# Patient Record
Sex: Male | Born: 1955 | Race: White | Hispanic: No | Marital: Single | Smoking: Former smoker
Health system: Southern US, Community
[De-identification: ages and names within clinical notes are randomized; demographics above are authoritative.]

## PROBLEM LIST (undated history)

## (undated) DIAGNOSIS — E559 Vitamin D deficiency, unspecified: Secondary | ICD-10-CM

## (undated) DIAGNOSIS — K2270 Barrett's esophagus without dysplasia: Secondary | ICD-10-CM

## (undated) DIAGNOSIS — Z86010 Personal history of colonic polyps: Secondary | ICD-10-CM

## (undated) DIAGNOSIS — I10 Essential (primary) hypertension: Secondary | ICD-10-CM

## (undated) DIAGNOSIS — M8580 Other specified disorders of bone density and structure, unspecified site: Secondary | ICD-10-CM

## (undated) DIAGNOSIS — G4730 Sleep apnea, unspecified: Secondary | ICD-10-CM

## (undated) DIAGNOSIS — K5190 Ulcerative colitis, unspecified, without complications: Secondary | ICD-10-CM

## (undated) DIAGNOSIS — M25512 Pain in left shoulder: Secondary | ICD-10-CM

## (undated) DIAGNOSIS — E785 Hyperlipidemia, unspecified: Secondary | ICD-10-CM

## (undated) DIAGNOSIS — T7840XA Allergy, unspecified, initial encounter: Secondary | ICD-10-CM

## (undated) DIAGNOSIS — K219 Gastro-esophageal reflux disease without esophagitis: Secondary | ICD-10-CM

## (undated) HISTORY — DX: Allergy, unspecified, initial encounter: T78.40XA

## (undated) HISTORY — DX: Barrett's esophagus without dysplasia: K22.70

## (undated) HISTORY — DX: Ulcerative colitis, unspecified, without complications: K51.90

## (undated) HISTORY — DX: Vitamin D deficiency, unspecified: E55.9

## (undated) HISTORY — DX: Hyperlipidemia, unspecified: E78.5

## (undated) HISTORY — DX: Gastro-esophageal reflux disease without esophagitis: K21.9

## (undated) HISTORY — PX: CHOLECYSTECTOMY: SHX55

## (undated) HISTORY — PX: UPPER GASTROINTESTINAL ENDOSCOPY: SHX188

## (undated) HISTORY — PX: TONSILLECTOMY: SUR1361

## (undated) HISTORY — PX: COLONOSCOPY: SHX174

## (undated) HISTORY — DX: Personal history of colonic polyps: Z86.010

---
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
## 2003-12-03 ENCOUNTER — Ambulatory Visit (HOSPITAL_COMMUNITY): Admission: RE | Admit: 2003-12-03 | Discharge: 2003-12-03 | Payer: Self-pay | Admitting: Gastroenterology

## 2004-01-02 ENCOUNTER — Encounter: Admission: RE | Admit: 2004-01-02 | Discharge: 2004-01-02 | Payer: Self-pay | Admitting: Gastroenterology

## 2004-02-10 ENCOUNTER — Encounter: Admission: RE | Admit: 2004-02-10 | Discharge: 2004-02-10 | Payer: Self-pay | Admitting: Gastroenterology

## 2004-03-19 ENCOUNTER — Observation Stay (HOSPITAL_COMMUNITY): Admission: RE | Admit: 2004-03-19 | Discharge: 2004-03-20 | Payer: Self-pay | Admitting: Surgery

## 2005-12-25 IMAGING — RF DG CHOLANGIOGRAM OPERATIVE
1 series · 13 of 13 positions shown · non-contrast
Comparison: none

CLINICAL DATA: Biliary dyskinesia.  Right upper quadrant abdominal pain.
 OPERATIVE CHOLANGIOGRAM, 03/19/04
 Comparing ultrasound of 02/10/04.

[Series 1: run · 13 of 13 slices shown]
[im 1/13]
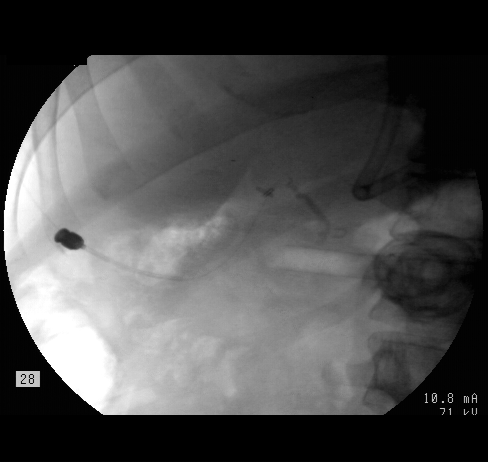
[im 2/13]
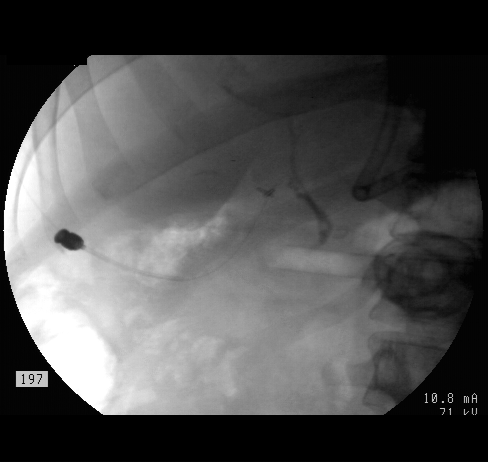
[im 3/13]
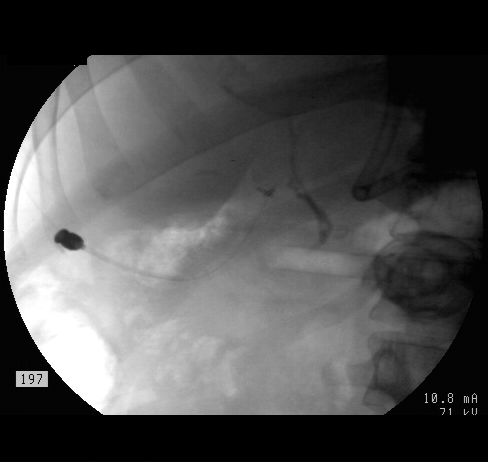
[im 4/13]
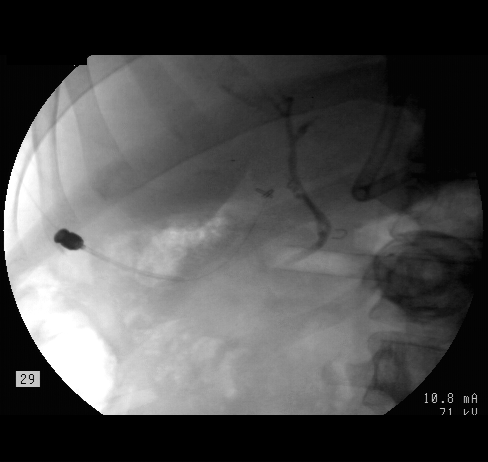
[im 5/13]
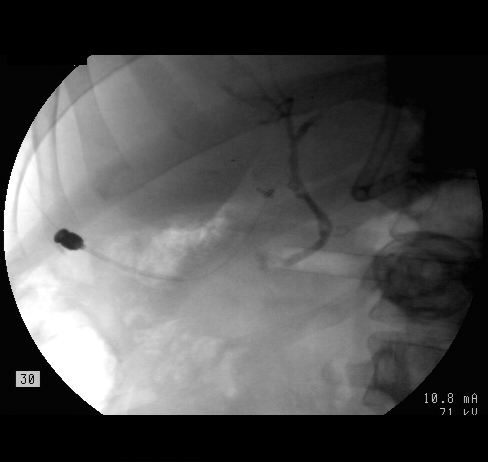
[im 6/13]
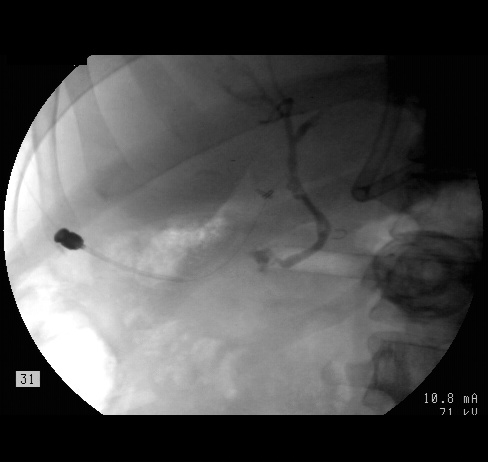
[im 7/13]
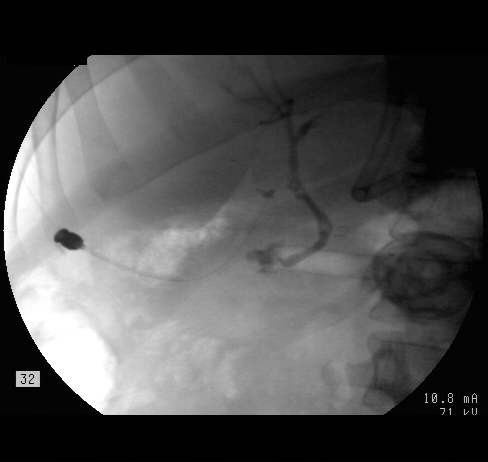
[im 8/13]
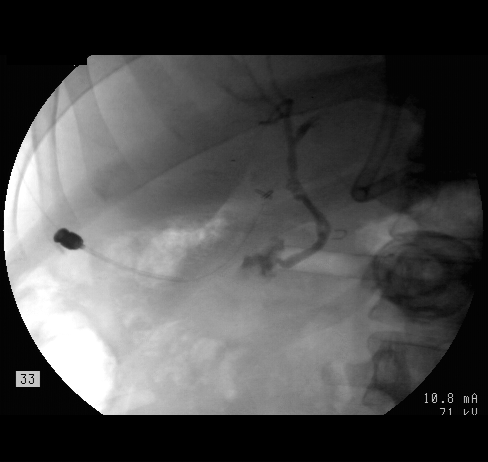
[im 9/13]
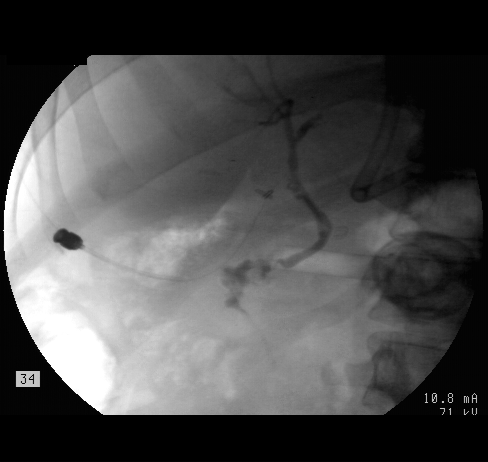
[im 10/13]
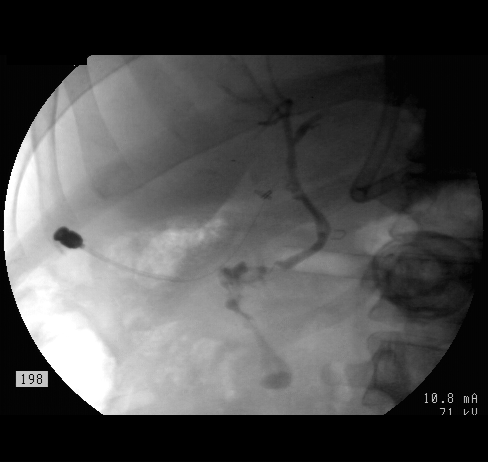
[im 11/13]
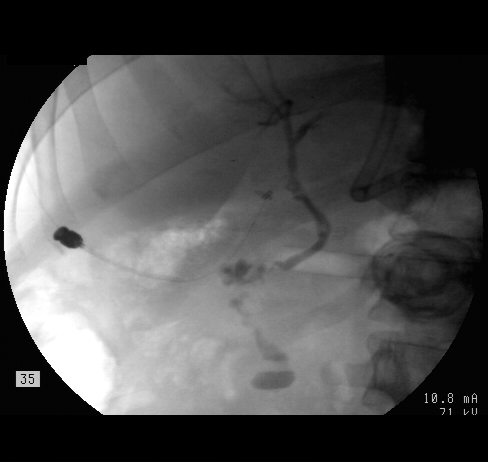
[im 12/13]
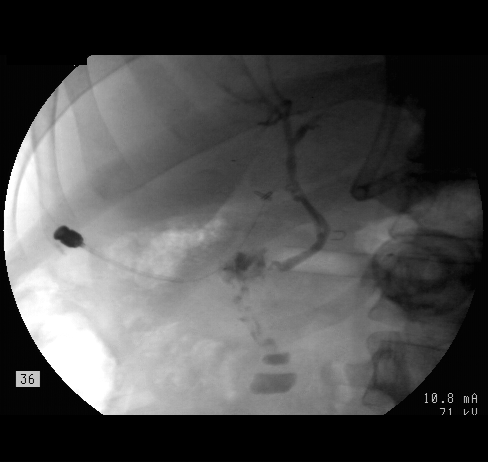
[im 13/13]
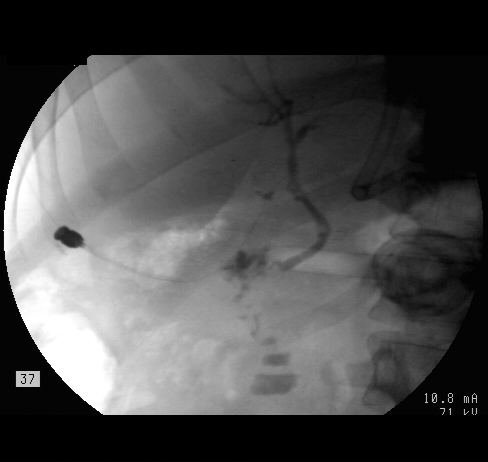

[13 of 13 positions shown; findings below may reference images not displayed]

FINDINGS: Operative cholangiogram was performed by Dr. Neesy Ya Neesy.  A series of consecutively obtained images demonstrate the cystic duct remnant to be cannulated.  This is injected and there is free extension of contrast into the duodenum.  Contrast extends up into the right and left hepatic ducts and intrahepatic bile ducts.
 In the vicinity of a staple in the common bile duct, there is a lucency over the common bile duct.  However, this appears to likely be associated with gas extending in the region of the duodenum or colon.  I doubt that this represents a retained non-mobile stone.
 IMPRESSION 
 No definite choledocholithiasis.  A vague lucency in the contrast column in the mid common bile duct appears to be associated with bowel gas rather than choledocholithiasis, and there is no associated obstruction.

## 2013-11-17 ENCOUNTER — Ambulatory Visit (INDEPENDENT_AMBULATORY_CARE_PROVIDER_SITE_OTHER): Payer: BC Managed Care – PPO | Admitting: *Deleted

## 2013-11-17 DIAGNOSIS — Z23 Encounter for immunization: Secondary | ICD-10-CM

## 2014-09-16 ENCOUNTER — Ambulatory Visit (INDEPENDENT_AMBULATORY_CARE_PROVIDER_SITE_OTHER): Payer: BC Managed Care – PPO | Admitting: *Deleted

## 2014-09-16 DIAGNOSIS — Z23 Encounter for immunization: Secondary | ICD-10-CM

## 2015-07-29 ENCOUNTER — Ambulatory Visit (INDEPENDENT_AMBULATORY_CARE_PROVIDER_SITE_OTHER): Payer: 59 | Admitting: Emergency Medicine

## 2015-07-29 VITALS — BP 142/90 | HR 68 | Temp 98.4°F | Resp 16 | Ht 67.5 in | Wt 191.0 lb

## 2015-07-29 DIAGNOSIS — Z23 Encounter for immunization: Secondary | ICD-10-CM

## 2015-07-29 DIAGNOSIS — Z0000 Encounter for general adult medical examination without abnormal findings: Secondary | ICD-10-CM

## 2015-07-29 DIAGNOSIS — K529 Noninfective gastroenteritis and colitis, unspecified: Secondary | ICD-10-CM | POA: Insufficient documentation

## 2015-07-29 DIAGNOSIS — E782 Mixed hyperlipidemia: Secondary | ICD-10-CM | POA: Diagnosis not present

## 2015-07-29 DIAGNOSIS — K5190 Ulcerative colitis, unspecified, without complications: Secondary | ICD-10-CM

## 2015-07-29 LAB — CBC
HCT: 46 % (ref 39.0–52.0)
HEMOGLOBIN: 15.8 g/dL (ref 13.0–17.0)
MCH: 32.6 pg (ref 26.0–34.0)
MCHC: 34.3 g/dL (ref 30.0–36.0)
MCV: 94.8 fL (ref 78.0–100.0)
MPV: 10.5 fL (ref 8.6–12.4)
PLATELETS: 187 10*3/uL (ref 150–400)
RBC: 4.85 MIL/uL (ref 4.22–5.81)
RDW: 13.5 % (ref 11.5–15.5)
WBC: 7.4 10*3/uL (ref 4.0–10.5)

## 2015-07-29 LAB — POCT UA - MICROSCOPIC ONLY
Bacteria, U Microscopic: NEGATIVE
Casts, Ur, LPF, POC: NEGATIVE
Crystals, Ur, HPF, POC: NEGATIVE
Yeast, UA: NEGATIVE

## 2015-07-29 LAB — POCT URINALYSIS DIPSTICK
Blood, UA: NEGATIVE
GLUCOSE UA: NEGATIVE
LEUKOCYTES UA: NEGATIVE
NITRITE UA: NEGATIVE
Protein, UA: NEGATIVE
Spec Grav, UA: >=1.03
UROBILINOGEN UA: 0.2
pH, UA: 5.5

## 2015-07-29 NOTE — Progress Notes (Signed)
Subjective:  Patient ID: Dennis Orr, male    DOB: 1956/07/21  Age: 59 y.o. MRN: [id number]  CC: Annual Exam   HPI Dennis Orr presents  for an annual physical. He has not had a physical examination was 3 years. He has a history of ulcerative colitis has been without treatment for that for several years due to change in his insurance policy related to his job loss.  History Dennis Orr has a past medical history of Allergy.   He has past surgical history that includes Cholecystectomy (N/A).   His  family history is not on file.  He   reports that he has never smoked. He does not have any smokeless tobacco history on file. He reports that he drinks about 10.8 oz of alcohol per week. He reports that he does not use illicit drugs.  No outpatient prescriptions prior to visit.   No facility-administered medications prior to visit.    Social History   Social History  . Marital Status: Single    Spouse Name: N/A  . Number of Children: N/A  . Years of Education: N/A   Social History Main Topics  . Smoking status: Never Smoker   . Smokeless tobacco: None  . Alcohol Use: 10.8 oz/week    18 Standard drinks or equivalent per week  . Drug Use: No  . Sexual Activity: Not Asked   Other Topics Concern  . None   Social History Narrative  . None     Review of Systems  Constitutional: Negative for fever, chills and appetite change.  HENT: Negative for congestion, ear pain, postnasal drip, sinus pressure and sore throat.   Eyes: Negative for pain and redness.  Respiratory: Negative for cough, shortness of breath and wheezing.   Cardiovascular: Negative for leg swelling.  Gastrointestinal: Negative for nausea, vomiting, abdominal pain, diarrhea, constipation and blood in stool.  Endocrine: Negative for polyuria.  Genitourinary: Negative for dysuria, urgency, frequency and flank pain.  Musculoskeletal: Negative for gait problem.  Skin: Negative for rash.  Neurological:  Negative for weakness and headaches.  Psychiatric/Behavioral: Negative for confusion and decreased concentration. The patient is not nervous/anxious.     Objective:  BP 142/90 mmHg  Pulse 68  Temp(Src) 98.4 F (36.9 C) (Oral)  Resp 16  Ht 5' 7.5" (1.715 m)  Wt 191 lb (86.637 kg)  BMI 29.46 kg/m2  SpO2 99%  Physical Exam  Constitutional: He is oriented to person, place, and time. He appears well-developed and well-nourished. No distress.  HENT:  Head: Normocephalic and atraumatic.  Right Ear: External ear normal.  Left Ear: External ear normal.  Nose: Nose normal.  Eyes: Conjunctivae and EOM are normal. Pupils are equal, round, and reactive to light. No scleral icterus.  Neck: Normal range of motion. Neck supple. No tracheal deviation present.  Cardiovascular: Normal rate, regular rhythm and normal heart sounds.   Pulmonary/Chest: Effort normal. No respiratory distress. He has no wheezes. He has no rales.  Abdominal: He exhibits no mass. There is no tenderness. There is no rebound and no guarding.  Musculoskeletal: He exhibits no edema.  Lymphadenopathy:    He has no cervical adenopathy.  Neurological: He is alert and oriented to person, place, and time. Coordination normal.  Skin: Skin is warm and dry. No rash noted.  Psychiatric: He has a normal mood and affect. His behavior is normal.      Assessment & Plan:   Dennis Orr was seen today for annual exam.  Diagnoses and all orders  for this visit:  Annual physical exam -     CBC -     Comprehensive metabolic panel -     Lipid panel -     POCT urinalysis dipstick -     POCT UA - Microscopic Only  Ulcerative colitis, without complications -     CBC -     Comprehensive metabolic panel -     Lipid panel -     POCT urinalysis dipstick -     POCT UA - Microscopic Only -     Ambulatory referral to Gastroenterology  Need for prophylactic vaccination and inoculation against influenza -     Flu Vaccine QUAD 36+ mos  IM   Dennis Orr does not currently have medications on file.  No orders of the defined types were placed in this encounter.    Appropriate red flag conditions were discussed with the patient as well as actions that should be taken.  Patient expressed his understanding.  Follow-up: Return if symptoms worsen or fail to improve.  Roselee Culver, MD

## 2015-07-29 NOTE — Patient Instructions (Signed)
Ulcerative Colitis Ulcerative colitis is a long lasting swelling and soreness (inflammation) of the colon (large intestine). In patients with ulcerative colitis, sores (ulcers) and inflammation of the inner lining of the colon lead to illness. Ulcerative colitis can also cause problems outside the digestive tract.  Ulcerative colitis is closely related to another condition of inflammation of the intestines called Crohn's disease. Together, they are frequently referred to as inflammatory bowel disease (IBD). Ulcerative colitis and Crohn's diseases are conditions that can last years to decades. Men and women are affected equally. They most commonly begin during adolescence and early adulthood. SYMPTOMS  Common symptoms of ulcerative colitis include rectal bleeding and diarrhea. There is a wide range of symptoms among patients with this disease depending on how severe the disease is. Some of these symptoms are:  Abdominal pain or cramping.  Diarrhea.  Fever.  Tiredness (fatigue).  Weight loss.  Night sweats.  Rectal pain.  Feeling the immediate need to have a bowel movement (rectal urgency). CAUSES  Ulcerative colitis is caused by increased activity of the immune system in the intestines. The immune system is the system that protects the body against disease such as harmful bacteria, viruses, fungi, and other foreign invaders. When the immune system overacts, it causes inflammation. The cause of the increased immune system activity is not known. This over activity causes long-lasting inflammation and ulceration. This condition may be passed down from your parents (inherited). Brothers, sisters, children, and parents of patients with IBD are more likely to develop these diseases. It is not contagious. This means you cannot catch it from someone else. DIAGNOSIS  Your caregiver may suspect ulcerative colitis based on your symptoms and exam. Blood tests may confirm that there is a problem. You may  be asked to submit a stool specimen for examination. X-rays and CT scans may be necessary. Ultimately, the diagnosis is usually made after a flexible tube is inserted via your anus and your colon is examined under sedation (colonoscopy). With this test, the specialist can take a tiny tissue sample from inside the bowel (biopsy). Examination of this biopsy tissue under a microscopy can reveal ulcerative colitis as the cause of your symptoms. TREATMENT   There is no cure for ulcerative colitis.  Complications such as massive bleeding from the colon (hemorrhage), development of a hole in the colon (perforation), or the development of precancerous or cancerous changes of the colon may require surgery.  Medications are often used to decrease inflammation and control the immune system. These include medicines related to aspirin, steroid medications, and newer and stronger medications to slow down the immune system. Some medications may be used as suppositories or enemas. A number of other medications are used or have been studied. Your caregiver will make specific recommendations. HOME CARE INSTRUCTIONS   There is no cure for ulcerative colitis disease. The best treatment is frequent checkups with your caregiver. Periodic reevaluation is important.  Symptoms such as diarrhea can be controlled with medications. Avoid foods that have a laxative effect such fresh fruit and vegetables and dairy products. During flare ups, you can rest your bowel by staying away from solid foods. Drink clear liquids frequently during the day. Electrolyte or rehydrating fluids are best. Your caregiver can help you with suggestions. Drink often to prevent dehydration. When diarrhea has cleared, eat smaller meals and more often. Avoid food additives and stimulants such as caffeine (coffee, tea, many sodas, or chocolate). Avoid dairy products. Enzyme supplements may help if you develop intolerance to  a sugar in dairy products  (lactose). Ask your caregiver or dietitian about specific dietary instructions.  If you had surgery, be sure you understand your care instructions thoroughly, including proper care of any surgical wounds.  Take any medications exactly as prescribed.  Try to maintain a positive attitude. Learn relaxation techniques such as self hypnosis, mental imaging, and muscle relaxation. If possible, avoid stresses that aggravate your condition. Exercise regularly. Follow your diet. Always get plenty of rest. SEEK MEDICAL CARE IF:   Your symptoms fail to improve after a week or two of new treatment.  You experience continued weight loss.  You have ongoing crampy digestion or loose bowels.  You develop a new skin rash, skin sores, or eye problems. SEEK IMMEDIATE MEDICAL CARE IF:   You have worsening of your symptoms or develop new symptoms.  You have an oral temperature above 102 F (38.9 C), not controlled by medicine.  You develop bloody diarrhea.  You have severe abdominal pain. Document Released: 08/10/2005 Document Revised: 01/23/2012 Document Reviewed: 07/10/2007 [facility] Patient Information 2015 [place], [place]. This information is not intended to replace advice given to you by your health care provider. Make sure you discuss any questions you have with your health care provider.

## 2015-07-30 ENCOUNTER — Encounter: Payer: Self-pay | Admitting: Internal Medicine

## 2015-07-30 LAB — COMPREHENSIVE METABOLIC PANEL
ALK PHOS: 56 U/L (ref 40–115)
ALT: 23 U/L (ref 9–46)
AST: 19 U/L (ref 10–35)
Albumin: 4.5 g/dL (ref 3.6–5.1)
BUN: 13 mg/dL (ref 7–25)
CO2: 27 mmol/L (ref 20–31)
CREATININE: 0.79 mg/dL (ref 0.70–1.33)
Calcium: 10 mg/dL (ref 8.6–10.3)
Chloride: 103 mmol/L (ref 98–110)
Glucose, Bld: 96 mg/dL (ref 65–99)
Potassium: 4.3 mmol/L (ref 3.5–5.3)
SODIUM: 142 mmol/L (ref 135–146)
TOTAL PROTEIN: 7.3 g/dL (ref 6.1–8.1)
Total Bilirubin: 0.6 mg/dL (ref 0.2–1.2)

## 2015-07-30 LAB — LIPID PANEL
CHOLESTEROL: 258 mg/dL — AB (ref 125–200)
HDL: 64 mg/dL (ref >=40)
LDL Cholesterol: 129 mg/dL (ref <130)
Total CHOL/HDL Ratio: 4 Ratio (ref <=5.0)
Triglycerides: 326 mg/dL — ABNORMAL HIGH (ref <150)
VLDL: 65 mg/dL — ABNORMAL HIGH (ref <30)

## 2015-07-30 MED ORDER — ATORVASTATIN CALCIUM 40 MG PO TABS: 40.0000 mg | ORAL_TABLET | Freq: Every day | ORAL | Status: DC

## 2015-07-30 NOTE — Addendum Note (Signed)
Addended by: Roselee Culver on: 07/30/2015 02:27 PM   Modules accepted: Orders, SmartSet

## 2015-07-31 ENCOUNTER — Other Ambulatory Visit: Payer: Self-pay

## 2015-07-31 MED ORDER — ATORVASTATIN CALCIUM 40 MG PO TABS: 40.0000 mg | ORAL_TABLET | Freq: Every day | ORAL | Status: DC

## 2015-07-31 NOTE — Telephone Encounter (Signed)
[facility]. She asked me to re-send pt's rx from [facility] to [facility]. Done.

## 2015-10-06 ENCOUNTER — Ambulatory Visit: Payer: Self-pay | Admitting: Internal Medicine

## 2015-10-29 ENCOUNTER — Ambulatory Visit (INDEPENDENT_AMBULATORY_CARE_PROVIDER_SITE_OTHER): Payer: 59 | Admitting: Family Medicine

## 2015-10-29 VITALS — BP 140/88 | HR 82 | Temp 98.4°F | Resp 18 | Ht 67.5 in | Wt 189.0 lb

## 2015-10-29 DIAGNOSIS — Z8669 Personal history of other diseases of the nervous system and sense organs: Secondary | ICD-10-CM

## 2015-10-29 DIAGNOSIS — E785 Hyperlipidemia, unspecified: Secondary | ICD-10-CM | POA: Diagnosis not present

## 2015-10-29 DIAGNOSIS — Z87898 Personal history of other specified conditions: Secondary | ICD-10-CM | POA: Diagnosis not present

## 2015-10-29 DIAGNOSIS — G4700 Insomnia, unspecified: Secondary | ICD-10-CM

## 2015-10-29 LAB — COMPLETE METABOLIC PANEL WITH GFR
ALBUMIN: 4.3 g/dL (ref 3.6–5.1)
ALK PHOS: 67 U/L (ref 40–115)
ALT: 24 U/L (ref 9–46)
AST: 18 U/L (ref 10–35)
BUN: 9 mg/dL (ref 7–25)
CALCIUM: 9.8 mg/dL (ref 8.6–10.3)
CHLORIDE: 103 mmol/L (ref 98–110)
CO2: 25 mmol/L (ref 20–31)
Creat: 0.83 mg/dL (ref 0.70–1.33)
Glucose, Bld: 83 mg/dL (ref 65–99)
POTASSIUM: 4.5 mmol/L (ref 3.5–5.3)
Sodium: 140 mmol/L (ref 135–146)
Total Bilirubin: 0.7 mg/dL (ref 0.2–1.2)
Total Protein: 7.1 g/dL (ref 6.1–8.1)

## 2015-10-29 LAB — LIPID PANEL
CHOLESTEROL: 176 mg/dL (ref 125–200)
HDL: 77 mg/dL (ref >=40)
LDL Cholesterol: 74 mg/dL (ref <130)
TRIGLYCERIDES: 127 mg/dL (ref <150)
Total CHOL/HDL Ratio: 2.3 Ratio (ref <=5.0)
VLDL: 25 mg/dL (ref <30)

## 2015-10-29 MED ORDER — ZOLPIDEM TARTRATE 5 MG PO TABS: 5.0000 mg | ORAL_TABLET | Freq: Every evening | ORAL | Status: DC | PRN

## 2015-10-29 NOTE — Patient Instructions (Signed)
Continue your atorvastatin daily  Continue with trying to get a lot of regular exercise. If you're not getting regular exercise on the job you should be trying to do some on your own.  Consider going back to the [facility] where you had your sleep study testing done. I would seriously recommend attempting using nasal pillows before giving up on CPAP.  Take the Ambien 5 mg (zolpidem) at bedtime. Return if this is not helping you sufficiently.

## 2015-10-29 NOTE — Progress Notes (Signed)
Patient ID: Dennis Orr, male    DOB: 05-01-1956  Age: 59 y.o. MRN: [id number]  Chief Complaint  Patient presents with  . Follow-up    lipids   . Medication Refill    lipitor  . Insomnia    hx of     Subjective:   59 year old man who is here for several issues as listed above. He has been on the atorvastatin for 3 months now, and tolerating it well. He has had trouble with other statins in the past. He is here to get his labs rechecked. He also used to be on Ambien for sleep and that did him well. He has been off that for a couple of years since he lost his insurance. He now has insurance again.  He has a remote history of sleep apnea, having been diagnosed at [facility]. Apparently he never could tolerate the mask. They change the pressure one time but it still caused him problems and he does not use that. We discussed the fact that sometimes the nasal pillows will work well despite intolerance of a mask. He does not have excessive daytime drowsiness, but lays awake a lot at night. Has trouble getting to sleep.  Current allergies, medications, problem list, past/family and social histories reviewed.  Objective:  BP 140/88 mmHg  Pulse 82  Temp(Src) 98.4 F (36.9 C) (Oral)  Resp 18  Ht 5' 7.5" (1.715 m)  Wt 189 lb (85.73 kg)  BMI 29.15 kg/m2  SpO2 98%  Pleasant gentleman in no acute distress. Neck supple without nodes or thyromegaly. Chest clear. Heart regular without murmur. No hepatosplenomegaly.  Assessment & Plan:   Assessment: 1. Hyperlipidemia   2. Insomnia   3. History of sleep apnea       Plan: Continue current medications. Resume using the Ambien, advised to use on a when necessary basis. Return in 6 months or as needed. We will let him know the results of his labs.  Orders Placed This Encounter  Procedures  . COMPLETE METABOLIC PANEL WITH GFR  . Lipid panel    Meds ordered this encounter  Medications  . zolpidem (AMBIEN) 5 MG tablet    Sig:  Take 1 tablet (5 mg total) by mouth at bedtime as needed for sleep.    Dispense:  30 tablet    Refill:  2         Patient Instructions  Continue your atorvastatin daily  Continue with trying to get a lot of regular exercise. If you're not getting regular exercise on the job you should be trying to do some on your own.  Consider going back to the [place] clinic where you had your sleep study testing done. I would seriously recommend attempting using nasal pillows before giving up on CPAP.  Take the Ambien 5 mg (zolpidem) at bedtime. Return if this is not helping you sufficiently.    Return in about 6 months (around 04/28/2016).   HOPPER,DAVID, MD 10/29/2015

## 2015-10-31 ENCOUNTER — Encounter: Payer: Self-pay | Admitting: *Deleted

## 2015-12-09 ENCOUNTER — Encounter: Payer: Self-pay | Admitting: Internal Medicine

## 2015-12-09 ENCOUNTER — Ambulatory Visit (INDEPENDENT_AMBULATORY_CARE_PROVIDER_SITE_OTHER): Payer: BLUE CROSS/BLUE SHIELD | Admitting: Internal Medicine

## 2015-12-09 VITALS — BP 144/90 | HR 68 | Ht 67.5 in | Wt 190.6 lb

## 2015-12-09 DIAGNOSIS — K51911 Ulcerative colitis, unspecified with rectal bleeding: Secondary | ICD-10-CM

## 2015-12-09 DIAGNOSIS — K2270 Barrett's esophagus without dysplasia: Secondary | ICD-10-CM

## 2015-12-09 NOTE — Patient Instructions (Signed)
  You have been scheduled for an endoscopy and colonoscopy. Please follow the written instructions given to you at your visit today. Please pick up your over the counter prep supplies at the pharmacy. If you use inhalers (even only as needed), please bring them with you on the day of your procedure. Your physician has requested that you go to www.[email or web address].com and enter the access code given to you at your visit today. This web site gives a general overview about your procedure. However, you should still follow specific instructions given to you by our office regarding your preparation for the procedure.   I appreciate the opportunity to care for you. Silvano Rusk, MD, [facility]

## 2015-12-09 NOTE — Assessment & Plan Note (Signed)
Hx - get records  Colonoscopy to stage current status Hold off on rx for now   The risks and benefits as well as alternatives of endoscopic procedure(s) have been discussed and reviewed. All questions answered. The patient agrees to proceed.

## 2015-12-09 NOTE — Assessment & Plan Note (Signed)
Reassess w/ EGD Review records The risks and benefits as well as alternatives of endoscopic procedure(s) have been discussed and reviewed. All questions answered. The patient agrees to proceed.

## 2015-12-09 NOTE — Progress Notes (Signed)
Referred by Dr. Annye Asa Subjective:    Patient ID: Dennis Orr, male    DOB: 19-Aug-1956, 60 y.o.   MRN: [id number] Cc: ulcerative colitis HPI Mr. Manzer is a 60 yo male who presents today with diagnoses of ulcerative colitis and Barrett's esophagus. He states he was diagnosed with both in 2009 and has had multiple colonoscopies and EGD's with Dr. Ferdinand Lango. Pathology from colonoscopies in 2009-2011 showed multiple polyps were removed and biopsied and found to be hyperplastic; rectal biopsies showed cryptitis and inflammation of the lamina propria. Also has had colitis in right colon. The patient was on Lialda for a couple of years, but states he did not have any relief or change in symptoms. Patient denies being on prednisone and has not taken any medications since 2013 when he lost his insurance. He has 2-3 watery BMs every day, without mucus or blood. There is blood on the tissue paper with wiping. He also states that for the past month, he has had brief episodes of LUQ once a week that may be brought on by food. He denies constipation, N/V, fever, weight loss.   Medications, allergies, past medical history, past surgical history, family history and social history are reviewed and updated in the EMR.   Review of Systems  Constitutional: Negative for fever and unexpected weight change.  Gastrointestinal: Positive for abdominal pain (LUQ ), diarrhea and anal bleeding. Negative for nausea, vomiting, constipation, blood in stool and abdominal distention.  All other systems reviewed and are negative.      Objective:   Physical Exam  Constitutional: He is oriented to person, place, and time. He appears well-developed and well-nourished.  HENT:  Head: Normocephalic and atraumatic.  Eyes: No scleral icterus.  Neck: No tracheal deviation present.  Cardiovascular: Normal rate, regular rhythm and normal heart sounds.  Exam reveals no gallop and no friction rub.   No murmur  heard. Pulmonary/Chest: Effort normal and breath sounds normal. He has no wheezes. He has no rales. He exhibits no tenderness.  Abdominal: Soft. Bowel sounds are normal. He exhibits no distension and no mass. There is no tenderness. There is no rebound and no guarding.  Neurological: He is alert and oriented to person, place, and time.  Skin: Skin is warm and dry.  Psychiatric: He has a normal mood and affect. His behavior is normal. Judgment normal.  Rectal exam: Deferred until colonoscopy    Data Reviewed:  Primary care notes 2016 Labs in EMR As per HPI (colonoscopies and pathology) Lab Results  Component Value Date   WBC 7.4 07/29/2015   HGB 15.8 07/29/2015   HCT 46.0 07/29/2015   MCV 94.8 07/29/2015   PLT 187 07/29/2015     Chemistry      Component Value Date/Time   NA 140 10/29/2015 1312   K 4.5 10/29/2015 1312   CL 103 10/29/2015 1312   CO2 25 10/29/2015 1312   BUN 9 10/29/2015 1312   CREATININE 0.83 10/29/2015 1312      Component Value Date/Time   CALCIUM 9.8 10/29/2015 1312   ALKPHOS 67 10/29/2015 1312   AST 18 10/29/2015 1312   ALT 24 10/29/2015 1312   BILITOT 0.7 10/29/2015 1312         Assessment & Plan:  Ulcerative colitis with rectal bleeding, unspecified location (Ama)  Barrett's esophagus without dysplasia  Hx colon polyps - all hyperplastic - some right-sided so could be sessile serrated polyps?  Family hx colon cancer - mother - need to clarify  age   Drinks 7 EtOH/day - review when he returns   Due to his history of UC and Barrett's esophagus a colonoscopy and EGD are necessary to evaluate the progression of the diseases. We will develop a treatment plan following the findings of the endoscopies. Documentation of colonoscopies from Dr. Ferdinand Lango was obtained. The risks and benefits of both endoscopies were explained to the patient and he was agreeable.  Esomeprazole OTC PRN until the procedures are completed.   I have seen the patient with  Foye Spurling PA-S - she has served as a Education administrator for this encounter.  Gatha Mayer, MD, [facility]

## 2015-12-11 ENCOUNTER — Telehealth: Payer: Self-pay

## 2015-12-11 NOTE — Telephone Encounter (Signed)
[facility] [address] [facility] talk with someone about ambien rx from 10/29/15 transferred from [place] and pt has come up as an audit and does not meet the requirements and [facility]   Best number [phone]

## 2015-12-11 NOTE — Telephone Encounter (Signed)
Can we reprint this Rx and fax to [phone]. They need a Rx with a signature on it. Pt had to transfer pharmacies due to insurance. This is for audit purposes. Thanks.

## 2015-12-12 NOTE — Telephone Encounter (Signed)
Since we no longer have the original Rx, I recommend contacting the [facility] and asking them to fax it to [place]. We [facility] then [facility].

## 2015-12-13 NOTE — Telephone Encounter (Signed)
[facility] that they need to call [facility] for the original rx.

## 2015-12-15 ENCOUNTER — Encounter: Payer: Self-pay | Admitting: Internal Medicine

## 2015-12-15 ENCOUNTER — Telehealth: Payer: Self-pay

## 2015-12-15 NOTE — Telephone Encounter (Signed)
[facility] [address] [facility] because [facility] haven't received a [facility] for zolpidem.  Fax: [phone]

## 2015-12-16 ENCOUNTER — Encounter: Payer: Self-pay | Admitting: Internal Medicine

## 2015-12-19 NOTE — Telephone Encounter (Signed)
Ask patient where she got the Ambien filled.  It would have be printed and given to her at the 12/15 visit, and had 2 refills so should have lasted until March.    Ruben Reason

## 2015-12-29 NOTE — Telephone Encounter (Signed)
Called and lmom to make sure he is good on his ambien rx. Dr. Linna Darner noted that he should have enough until march. Advised he call back only if having a problem with his rx.

## 2016-01-05 ENCOUNTER — Ambulatory Visit (AMBULATORY_SURGERY_CENTER): Payer: BLUE CROSS/BLUE SHIELD | Admitting: Internal Medicine

## 2016-01-05 ENCOUNTER — Encounter: Payer: Self-pay | Admitting: Internal Medicine

## 2016-01-05 VITALS — BP 131/73 | HR 79 | Temp 98.6°F | Resp 14 | Ht 67.5 in | Wt 190.0 lb

## 2016-01-05 DIAGNOSIS — K51911 Ulcerative colitis, unspecified with rectal bleeding: Secondary | ICD-10-CM

## 2016-01-05 DIAGNOSIS — D128 Benign neoplasm of rectum: Secondary | ICD-10-CM | POA: Diagnosis not present

## 2016-01-05 DIAGNOSIS — K219 Gastro-esophageal reflux disease without esophagitis: Secondary | ICD-10-CM | POA: Diagnosis not present

## 2016-01-05 DIAGNOSIS — D124 Benign neoplasm of descending colon: Secondary | ICD-10-CM | POA: Diagnosis not present

## 2016-01-05 DIAGNOSIS — K317 Polyp of stomach and duodenum: Secondary | ICD-10-CM | POA: Diagnosis not present

## 2016-01-05 DIAGNOSIS — K2270 Barrett's esophagus without dysplasia: Secondary | ICD-10-CM | POA: Diagnosis not present

## 2016-01-05 DIAGNOSIS — D129 Benign neoplasm of anus and anal canal: Secondary | ICD-10-CM

## 2016-01-05 DIAGNOSIS — K529 Noninfective gastroenteritis and colitis, unspecified: Secondary | ICD-10-CM | POA: Diagnosis not present

## 2016-01-05 MED ORDER — PANTOPRAZOLE SODIUM 40 MG PO TBEC: 40.0000 mg | DELAYED_RELEASE_TABLET | Freq: Every day | ORAL | Status: DC

## 2016-01-05 MED ORDER — SODIUM CHLORIDE 0.9 % IV SOLN: 500.0000 mL | INTRAVENOUS | Status: DC

## 2016-01-05 NOTE — Progress Notes (Signed)
Report to PACU, RN, vss, BBS= Clear.  

## 2016-01-05 NOTE — Patient Instructions (Addendum)
I saw changes of reflux esophagitis - not sure you have Barrett's esophagus - biopsies taken. Also some stomach polyps. I have prescribed pantoprazole to treat this - you can stop the Nexium OTC.   There might be some colitis in the colon - biopsies taken and I removed 5 polyps.  We will call with results and plans.  I appreciate the opportunity to care for you. Gatha Mayer, MD, FACG YOU HAD AN ENDOSCOPIC PROCEDURE TODAY AT [facility]:   Refer to the procedure report that was given to you for any specific questions about what was found during the examination.  If the procedure report does not answer your questions, please call your gastroenterologist to clarify.  If you requested that your care partner not be given the details of your procedure findings, then the procedure report has been included in a sealed envelope for you to review at your convenience later.  YOU SHOULD EXPECT: Some feelings of bloating in the abdomen. Passage of more gas than usual.  Walking can help get rid of the air that was put into your GI tract during the procedure and reduce the bloating. If you had a lower endoscopy (such as a colonoscopy or flexible sigmoidoscopy) you may notice spotting of blood in your stool or on the toilet paper. If you underwent a bowel prep for your procedure, you may not have a normal bowel movement for a few days.  Please Note:  You might notice some irritation and congestion in your nose or some drainage.  This is from the oxygen used during your procedure.  There is no need for concern and it should clear up in a day or so.  SYMPTOMS TO REPORT IMMEDIATELY:   Following lower endoscopy (colonoscopy or flexible sigmoidoscopy):  Excessive amounts of blood in the stool  Significant tenderness or worsening of abdominal pains  Swelling of the abdomen that is new, acute  Fever of 100F or higher   Following upper endoscopy (EGD)  Vomiting of blood or coffee  ground material  New chest pain or pain under the shoulder blades  Painful or persistently difficult swallowing  New shortness of breath  Fever of 100F or higher  Black, tarry-looking stools  For urgent or emergent issues, a gastroenterologist can be reached at any hour by calling [phone].   DIET: Your first meal following the procedure should be a small meal and then it is ok to progress to your normal diet. Heavy or fried foods are harder to digest and may make you feel nauseous or bloated.  Likewise, meals heavy in dairy and vegetables can increase bloating.  Drink plenty of fluids but you should avoid alcoholic beverages for 24 hours.  ACTIVITY:  You should plan to take it easy for the rest of today and you should NOT DRIVE or use heavy machinery until tomorrow (because of the sedation medicines used during the test).    FOLLOW UP: Our staff will call the number listed on your records the next business day following your procedure to check on you and address any questions or concerns that you may have regarding the information given to you following your procedure. If we do not reach you, we will leave a message.  However, if you are feeling well and you are not experiencing any problems, there is no need to return our call.  We will assume that you have returned to your regular daily activities without incident.  If any biopsies  were taken you will be contacted by phone or by letter within the next 1-3 weeks.  Please call us at [phone] if you have not heard about the biopsies in 3 weeks.    SIGNATURES/CONFIDENTIALITY: You and/or your care partner have signed paperwork which will be entered into your electronic medical record.  These signatures attest to the fact that that the information above on your After Visit Summary has been reviewed and is understood.  Full responsibility of the confidentiality of this discharge information lies with you and/or your  care-partner.  Hiatal hernia, diverticulosis, polyp information given.  Hemorrhoid information given.  Stop Nexium and start pantoprazole as ordered.

## 2016-01-05 NOTE — Op Note (Signed)
[place]  [address] [place], [address]   COLONOSCOPY PROCEDURE REPORT  PATIENT: Dennis, Orr  MR#: [id number] BIRTHDATE: [birth year] , 22  yrs. old GENDER: male ENDOSCOPIST: Gatha Mayer, MD, [facility] PROCEDURE DATE:  01/05/2016 PROCEDURE:   Colonoscopy, diagnostic, Colonoscopy with biopsy, and Colonoscopy with snare polypectomy First Screening Colonoscopy - Avg.  risk and is 50 yrs.  old or older - No.  Prior Negative Screening - Now for repeat screening. N/A  History of Adenoma - Now for follow-up colonoscopy & has been > or = to 3 yrs.  N/A  Polyps removed today? Yes ASA CLASS:   Class II INDICATIONS:Inflammatory bowel disease of the intestine if more precise diagnosis or determination of the extent / severity of activity of disease will influence immediate / future management and Patient is not applicable for Colorectal Neoplasm Risk Assessment for this procedure. MEDICATIONS: Residual sedation present, Propofol 400 mg IV, and Lidocaine 160 mg IV  DESCRIPTION OF PROCEDURE:   After the risks benefits and alternatives of the procedure were thoroughly explained, informed consent was obtained.  The digital rectal exam revealed no abnormalities of the rectum, revealed no prostatic nodules, and revealed the prostate was not enlarged.   The LB [id number] endoscope was introduced through the anus and advanced to the cecum, which was identified by both the appendix and ileocecal valve. No adverse events experienced.   The quality of the prep was good.  (MiraLax was used)  The instrument was then slowly withdrawn as the colon was fully examined. Estimated blood loss is zero unless otherwise noted in this procedure report.      COLON FINDINGS: Colitis vs some barotrauma in right colon - aphthae and subepithelial heme - biopsied.   Five polypoid shaped pedunculated and sessile polyps ranging from 4 to 37m in size were found in the rectum and  descending colon.  Polypectomies were performed using snare cautery (15 mm descending)  and with a cold snare 9all others).  The resection was complete, the polyp tissue was completely retrieved and sent to histology.   There was moderate diverticulosis noted in the sigmoid colon.   The examination was otherwise normal - left colon biopsies taken. Internal hemorrhoids were found.  Retroflexed views revealed internal hemorrhoids. The time to cecum = 3.6 Withdrawal time = 19.2   The scope was withdrawn and the procedure completed. COMPLICATIONS: There were no immediate complications.  ENDOSCOPIC IMPRESSION: 1.   Colitis vs some barotrauma in right colon - aphthae and subepithelial heme - biopsied 2.   Five polyps ranging from 4 to 174min size were found in the rectum and descending colon; polypectomies were performed using snare cautery and with a cold snare 3.   Moderate diverticulosis was noted in the sigmoid colon 4.   The examination was otherwise normal - left colon biopsies also taken 5.   Internal hemorrhoids  RECOMMENDATIONS: 1.  Timing of repeat colonoscopy will be determined by pathology findings. 2.  Hold Aspirin and all other NSAIDS for 2 weeks.  eSigned:  CaGatha MayerMD, FAChristus Dubuis Hospital Of Beaumont2/21/2017 4:42 PM   cc: The Patient and Dr. StNena Jordan PATIENT NAME:  [name], AlfieriR#: [id number]

## 2016-01-05 NOTE — Progress Notes (Signed)
Called to room to assist during endoscopic procedure.  Patient ID and intended procedure confirmed with present staff. Received instructions for my participation in the procedure from the performing physician.  

## 2016-01-05 NOTE — Op Note (Signed)
[place]  [address]. [place], [address]   ENDOSCOPY PROCEDURE REPORT  PATIENT: Dennis, Orr  MR#: [id number] BIRTHDATE: 1956/02/28 , 59  yrs. old GENDER: male ENDOSCOPIST: Gatha Mayer, MD, [facility] PROCEDURE DATE:  01/05/2016 PROCEDURE:  EGD, diagnostic ASA CLASS:     Class II INDICATIONS:  hx Barrett's, GERD. MEDICATIONS: Propofol 200 mg IV and Monitored anesthesia care TOPICAL ANESTHETIC: none  DESCRIPTION OF PROCEDURE: After the risks benefits and alternatives of the procedure were thoroughly explained, informed consent was obtained.  The LB [id number] endoscope was introduced through the mouth and advanced to the second portion of the duodenum , Without limitations.  The instrument was slowly withdrawn as the mucosa was fully examined.    1) Erosion just above GE junctiion - biopsied - and some irregularity/inflammatory changes seen at Z-line on retroflexion. No obvious Barrett's esophagus. 2) 3-4 cm hiatal hernia 3) Multiple small fleshy polyps in body - biopsied. 4) Otherwise normal EGD. Retroflexed views revealed as previously described.     The scope was then withdrawn from the patient and the procedure completed.  COMPLICATIONS: There were no immediate complications.  ENDOSCOPIC IMPRESSION: 1) Erosion just above GE junctiion - biopsied - and some irregularity/inflammatory changes seen at Z-line on retroflexion. No obvious Barrett's esophagus. 2) 3-4 cm hiatal hernia 3) Multiple small fleshy polyps in body - biopsied. 4) Otherwise normal EGD  RECOMMENDATIONS: 1.  Stop prn OTC Nexium and start pantoprazole 40 mg qd - Rx sent 2.  Proceed with a Colonoscopy.  eSigned:  Gatha Mayer, MD, [facility] 01/05/2016 4:33 PM    CC:The Patient and Dr. Nena Jordan

## 2016-01-05 NOTE — Progress Notes (Signed)
No egg or soy allergy known to patient  No issues with past sedation with any surgeries  or procedures, no intubation problems  No diet pills per patient No home 02 use per patient  No blood thinners per patient

## 2016-01-06 ENCOUNTER — Telehealth: Payer: Self-pay

## 2016-01-06 NOTE — Telephone Encounter (Signed)
  Follow up Call-  Call back number 01/05/2016  Post procedure Call Back phone  # [phone]  Permission to leave phone message Yes    Patient was called for follow up after procedure on 01/05/2016. No answer at the number given for follow up. A message was left on his answering machine.

## 2016-01-13 ENCOUNTER — Encounter: Payer: Self-pay | Admitting: Internal Medicine

## 2016-01-13 ENCOUNTER — Other Ambulatory Visit: Payer: Self-pay

## 2016-01-13 DIAGNOSIS — Z86010 Personal history of colonic polyps: Secondary | ICD-10-CM

## 2016-01-13 DIAGNOSIS — Z860101 Personal history of adenomatous and serrated colon polyps: Secondary | ICD-10-CM

## 2016-01-13 HISTORY — DX: Personal history of adenomatous and serrated colon polyps: Z86.0101

## 2016-01-13 HISTORY — DX: Personal history of colonic polyps: Z86.010

## 2016-01-13 MED ORDER — MESALAMINE 1.2 G PO TBEC: 2.4000 g | DELAYED_RELEASE_TABLET | Freq: Two times a day (BID) | ORAL | Status: DC

## 2016-01-13 NOTE — Progress Notes (Signed)
Quick Note:  Call patient from office: 4 adenomas - colon polyps -  Mild colitis Does not have Barrett's Gastric polyps benign  please ask him to retry Lialda 4.8 g daily (2.4 bid # 120 tabs 3 RF)and keep taking til he sees me and use loperamide prn diarrhea  See me in 8 weeks   LEC no letter but needs 2 yr colon recall  ______

## 2016-01-14 ENCOUNTER — Telehealth: Payer: Self-pay

## 2016-01-14 NOTE — Telephone Encounter (Signed)
Filled out form and faxed to [employer] to get prior authorization for Lialda.  Dx:  Ulcerative colitis, K51.90.

## 2016-01-18 MED ORDER — MESALAMINE 400 MG PO CPDR: 1600.0000 mg | DELAYED_RELEASE_CAPSULE | Freq: Three times a day (TID) | ORAL | Status: DC

## 2016-01-18 NOTE — Telephone Encounter (Signed)
delzicol 400 mg take 4 tid (tell him he can take 6 twice a day but don't put that in sig)  # 360 5 RF

## 2016-01-18 NOTE — Telephone Encounter (Signed)
Informed Terrick of new rx sent in to try since insurance won't cover the [place].  Informed him of dosing as well.

## 2016-01-18 NOTE — Telephone Encounter (Signed)
Got a denial for the Lialda.  Patient must try and fail Delzicol. Please advise Sir, thank you.

## 2016-03-10 ENCOUNTER — Ambulatory Visit (INDEPENDENT_AMBULATORY_CARE_PROVIDER_SITE_OTHER): Payer: BLUE CROSS/BLUE SHIELD | Admitting: Internal Medicine

## 2016-03-10 ENCOUNTER — Encounter: Payer: Self-pay | Admitting: Internal Medicine

## 2016-03-10 ENCOUNTER — Other Ambulatory Visit (INDEPENDENT_AMBULATORY_CARE_PROVIDER_SITE_OTHER): Payer: BLUE CROSS/BLUE SHIELD

## 2016-03-10 VITALS — BP 140/80 | HR 80 | Ht 65.75 in | Wt 195.1 lb

## 2016-03-10 DIAGNOSIS — Z23 Encounter for immunization: Secondary | ICD-10-CM | POA: Diagnosis not present

## 2016-03-10 DIAGNOSIS — K5190 Ulcerative colitis, unspecified, without complications: Secondary | ICD-10-CM | POA: Diagnosis not present

## 2016-03-10 DIAGNOSIS — R030 Elevated blood-pressure reading, without diagnosis of hypertension: Secondary | ICD-10-CM

## 2016-03-10 DIAGNOSIS — M2550 Pain in unspecified joint: Secondary | ICD-10-CM

## 2016-03-10 DIAGNOSIS — E559 Vitamin D deficiency, unspecified: Secondary | ICD-10-CM

## 2016-03-10 DIAGNOSIS — IMO0001 Reserved for inherently not codable concepts without codable children: Secondary | ICD-10-CM

## 2016-03-10 HISTORY — DX: Vitamin D deficiency, unspecified: E55.9

## 2016-03-10 LAB — COMPREHENSIVE METABOLIC PANEL
ALT: 26 U/L (ref 0–53)
AST: 18 U/L (ref 0–37)
Albumin: 4.5 g/dL (ref 3.5–5.2)
Alkaline Phosphatase: 67 U/L (ref 39–117)
BILIRUBIN TOTAL: 0.7 mg/dL (ref 0.2–1.2)
BUN: 13 mg/dL (ref 6–23)
CALCIUM: 10 mg/dL (ref 8.4–10.5)
CHLORIDE: 101 meq/L (ref 96–112)
CO2: 29 meq/L (ref 19–32)
CREATININE: 0.85 mg/dL (ref 0.40–1.50)
GFR: 98.16 mL/min (ref >60.00)
GLUCOSE: 95 mg/dL (ref 70–99)
Potassium: 4.4 mEq/L (ref 3.5–5.1)
Sodium: 138 mEq/L (ref 135–145)
Total Protein: 7.7 g/dL (ref 6.0–8.3)

## 2016-03-10 LAB — VITAMIN D 25 HYDROXY (VIT D DEFICIENCY, FRACTURES): VITD: 13.39 ng/mL — ABNORMAL LOW (ref 30.00–100.00)

## 2016-03-10 MED ORDER — MESALAMINE 400 MG PO CPDR: 1200.0000 mg | DELAYED_RELEASE_CAPSULE | Freq: Two times a day (BID) | ORAL | Status: DC

## 2016-03-10 NOTE — Progress Notes (Signed)
Quick Note:  Vitamin D is very low other labs all ok (kidney fx, liver tests, electrolytes) 1) Vit D3 50K U weekly x 12 weeks and recheck Vit D 2) DEXA scan - vitamin D deficiency ______

## 2016-03-10 NOTE — Patient Instructions (Addendum)
  Your physician has requested that you go to the basement for the following lab work before leaving today: CMET, Vitamin D   Per Dr Carlean Purl decrease your Delzicol to 3 tablets twice a day.  Today you have been given a pneumovac vaccine and information sheet.  Follow up with Dr Carlean Purl in a year.    I appreciate the opportunity to care for you.

## 2016-03-10 NOTE — Assessment & Plan Note (Signed)
sxs improved but arthralgias

## 2016-03-10 NOTE — Progress Notes (Signed)
   Subjective:    Patient ID: Dennis Orr, male    DOB: 14-Aug-1956, 60 y.o.   MRN: [id number] Chief complaint: Follow-up ulcerative colitis HPI The patient noticed arthralgias since starting Delzicol. He stopped that and his Lipitor felt better restarted Delzicol and was having joint pains. Does not really have myalgias. No joint swelling. Otherwise has been well sounds like his colitis symptoms with diarrhea are improved. He has just recently started taking 6 Delzicol a day for a total of 2.4 g down from the 4.8 g I started, and he says he seems okay on that. Medications, allergies, past medical history, past surgical history, family history and social history are reviewed and updated in the EMR.  Review of Systems As above    Objective:   Physical Exam BP 150/88 mmHg  Pulse 80  Ht 5' 5.75" (1.67 m)  Wt 195 lb 2 oz (88.508 kg)  BMI 31.74 kg/m2 Hand and finger joints looked normal there is no swelling or deformity nontender  His colonoscopy though very 2017 demonstrated mild right sided ulcerative colitis as well as 5 polyps and four were adenomatous. Earlier colonoscopy had shown some rectal changes as well he seems to have patchy disease, this could be a variant of Crohn's disease though it is overall mild endoscopically and treatment would be the same.    Assessment & Plan:   Encounter Diagnoses  Name Primary?  . Ulcerative colitis without complications, unspecified location ([place]) Yes  . Arthralgia   . Elevated blood pressure    We'll see how he does with total of 2.4 g mesalamine in the form of Delzicol daily. Hopefully he will not have arthralgias and will control his colitis. I anticipate a routine visit in a year, a call back or my chart note or visit when necessary depending upon how he does.  Pneumovax will be administered today the 23 polyvalent vaccine, given his ulcerative colitis this is recommended that he have this before the standard age, and we'll check a vitamin D  level as well.  Center DEXA scanning at some point. This is also recommended in IBD. He has never been on prednisone insomnia and really a hurry to do this. Certainly if vitamin D is low that could add to the indication.  Recheck BP improved  150/88 to 140/80 - f/u PCP [facility]

## 2016-03-11 ENCOUNTER — Other Ambulatory Visit: Payer: Self-pay

## 2016-03-11 DIAGNOSIS — E559 Vitamin D deficiency, unspecified: Secondary | ICD-10-CM

## 2016-03-11 MED ORDER — VITAMIN D (ERGOCALCIFEROL) 1.25 MG (50000 UNIT) PO CAPS: 50000.0000 [IU] | ORAL_CAPSULE | ORAL | Status: DC

## 2016-03-14 ENCOUNTER — Encounter: Payer: Self-pay | Admitting: Family Medicine

## 2016-03-14 ENCOUNTER — Other Ambulatory Visit: Payer: Self-pay

## 2016-03-14 DIAGNOSIS — E559 Vitamin D deficiency, unspecified: Secondary | ICD-10-CM

## 2016-03-15 ENCOUNTER — Other Ambulatory Visit: Payer: Self-pay | Admitting: Family Medicine

## 2016-03-15 DIAGNOSIS — G4700 Insomnia, unspecified: Secondary | ICD-10-CM

## 2016-03-15 MED ORDER — ZOLPIDEM TARTRATE 5 MG PO TABS: 5.0000 mg | ORAL_TABLET | Freq: Every evening | ORAL | Status: DC | PRN

## 2016-03-16 ENCOUNTER — Other Ambulatory Visit: Payer: Self-pay | Admitting: Family Medicine

## 2016-03-18 ENCOUNTER — Ambulatory Visit (INDEPENDENT_AMBULATORY_CARE_PROVIDER_SITE_OTHER)
Admission: RE | Admit: 2016-03-18 | Discharge: 2016-03-18 | Disposition: A | Discharge status: Home / Self Care | Payer: BLUE CROSS/BLUE SHIELD | Source: Ambulatory Visit | Attending: Family Medicine | Admitting: Family Medicine

## 2016-03-18 DIAGNOSIS — M859 Disorder of bone density and structure, unspecified: Secondary | ICD-10-CM

## 2016-03-18 DIAGNOSIS — M8580 Other specified disorders of bone density and structure, unspecified site: Secondary | ICD-10-CM

## 2016-03-18 DIAGNOSIS — E559 Vitamin D deficiency, unspecified: Secondary | ICD-10-CM | POA: Diagnosis not present

## 2016-03-18 HISTORY — DX: Other specified disorders of bone density and structure, unspecified site: M85.80

## 2016-03-20 DIAGNOSIS — M859 Disorder of bone density and structure, unspecified: Secondary | ICD-10-CM

## 2016-03-20 DIAGNOSIS — E559 Vitamin D deficiency, unspecified: Secondary | ICD-10-CM | POA: Diagnosis not present

## 2016-03-20 DIAGNOSIS — M8580 Other specified disorders of bone density and structure, unspecified site: Secondary | ICD-10-CM

## 2016-03-20 HISTORY — DX: Disorder of bone density and structure, unspecified: M85.9

## 2016-03-20 HISTORY — DX: Other specified disorders of bone density and structure, unspecified site: M85.80

## 2016-03-20 NOTE — Progress Notes (Signed)
Quick Note:  Bones are thin repleting vitamin D can help this and also help the joint pains Await f/u vit D level Repeat DEXA 2 years ______

## 2016-06-08 ENCOUNTER — Other Ambulatory Visit: Payer: Self-pay | Admitting: Internal Medicine

## 2016-06-23 ENCOUNTER — Other Ambulatory Visit: Payer: Self-pay | Admitting: Family Medicine

## 2016-06-23 DIAGNOSIS — G4700 Insomnia, unspecified: Secondary | ICD-10-CM

## 2016-06-26 NOTE — Telephone Encounter (Signed)
Needs seen by someone before refills.  I do not see regular patients any longer.

## 2016-06-26 NOTE — Telephone Encounter (Signed)
Last visit was told to return in 6 mo which would have been June.  I cannot refill at this time.  He needs a follow up with someone.  I am not seeing regular patients any longer.  Can come in and see whoever.

## 2016-06-27 NOTE — Telephone Encounter (Signed)
tried

## 2016-06-27 NOTE — Telephone Encounter (Signed)
Tried to call pt but his VM was not set up and could not leave message. Please notify pt of Dr Hopper's instr's if pt calls back.

## 2017-05-15 ENCOUNTER — Ambulatory Visit (INDEPENDENT_AMBULATORY_CARE_PROVIDER_SITE_OTHER): Payer: BC Managed Care – PPO | Admitting: Physician Assistant

## 2017-05-15 ENCOUNTER — Encounter: Payer: Self-pay | Admitting: Physician Assistant

## 2017-05-15 VITALS — BP 170/88 | HR 94 | Temp 99.2°F | Resp 18 | Ht 66.54 in | Wt 200.8 lb

## 2017-05-15 DIAGNOSIS — E7800 Pure hypercholesterolemia, unspecified: Secondary | ICD-10-CM | POA: Insufficient documentation

## 2017-05-15 DIAGNOSIS — Z23 Encounter for immunization: Secondary | ICD-10-CM

## 2017-05-15 DIAGNOSIS — G4700 Insomnia, unspecified: Secondary | ICD-10-CM

## 2017-05-15 DIAGNOSIS — Z0000 Encounter for general adult medical examination without abnormal findings: Secondary | ICD-10-CM | POA: Diagnosis not present

## 2017-05-15 DIAGNOSIS — K5190 Ulcerative colitis, unspecified, without complications: Secondary | ICD-10-CM

## 2017-05-15 DIAGNOSIS — Z114 Encounter for screening for human immunodeficiency virus [HIV]: Secondary | ICD-10-CM | POA: Diagnosis not present

## 2017-05-15 DIAGNOSIS — Z125 Encounter for screening for malignant neoplasm of prostate: Secondary | ICD-10-CM

## 2017-05-15 DIAGNOSIS — E559 Vitamin D deficiency, unspecified: Secondary | ICD-10-CM

## 2017-05-15 DIAGNOSIS — R030 Elevated blood-pressure reading, without diagnosis of hypertension: Secondary | ICD-10-CM | POA: Diagnosis not present

## 2017-05-15 DIAGNOSIS — Z1159 Encounter for screening for other viral diseases: Secondary | ICD-10-CM

## 2017-05-15 DIAGNOSIS — Z8669 Personal history of other diseases of the nervous system and sense organs: Secondary | ICD-10-CM | POA: Diagnosis not present

## 2017-05-15 MED ORDER — CHLORTHALIDONE 25 MG PO TABS: 25.0000 mg | ORAL_TABLET | Freq: Every day | ORAL | 1 refills | Status: DC

## 2017-05-15 MED ORDER — ZOLPIDEM TARTRATE 5 MG PO TABS: 5.0000 mg | ORAL_TABLET | Freq: Every evening | ORAL | 5 refills | Status: DC | PRN

## 2017-05-15 NOTE — Patient Instructions (Addendum)
IF you received an x-ray today, you will receive an invoice from [facility]. Please contact [facility] at [phone] with questions or concerns regarding your invoice.   IF you received labwork today, you will receive an invoice from [place]. Please contact [facility] at [phone] with questions or concerns regarding your invoice.   Our billing staff will not be able to assist you with questions regarding bills from these companies.  You will be contacted with the lab results as soon as they are available. The fastest way to get your results is to activate your My Chart account. Instructions are located on the last page of this paperwork. If you have not heard from [place] regarding the results in 2 weeks, please contact this office.     Keeping you healthy  Get these tests  Blood pressure- Have your blood pressure checked once a year by your healthcare provider.  Normal blood pressure is 120/80  Weight- Have your body mass index (BMI) calculated to screen for obesity.  BMI is a measure of body fat based on height and weight. You can also calculate your own BMI at [employer].[email or web address].  Cholesterol- Have your cholesterol checked every year.  Diabetes- Have your blood sugar checked regularly if you have high blood pressure, high cholesterol, have a family history of diabetes or if you are overweight.  Screening for Colon Cancer- Colonoscopy starting at age 71.  Screening may begin sooner depending on your family history and other health conditions. Follow up colonoscopy as directed by your Gastroenterologist.  Screening for Prostate Cancer- Both blood work (PSA) and a rectal exam help screen for Prostate Cancer.  Screening begins at age 65 with African-American men and at age 85 with Caucasian men.  Screening may begin sooner depending on your family history.  Take these medicines  Aspirin- One aspirin daily can help prevent Heart disease and Stroke.  Flu  shot- Every fall.  Tetanus- Every 10 years.  Zostavax- Once after the age of 42 to prevent Shingles.  Pneumonia shot- Once after the age of 99; if you are younger than 73, ask your healthcare provider if you need a Pneumonia shot.  Take these steps  Don't smoke- If you do smoke, talk to your doctor about quitting.  For tips on how to quit, go to www.smokefree.gov or call 1-800-QUIT-NOW.  Be physically active- Exercise 5 days a week for at least 30 minutes.  If you are not already physically active start slow and gradually work up to 30 minutes of moderate physical activity.  Examples of moderate activity include walking briskly, mowing the yard, dancing, swimming, bicycling, etc.  Eat a healthy diet- Eat a variety of healthy food such as fruits, vegetables, low fat milk, low fat cheese, yogurt, lean meant, poultry, fish, beans, tofu, etc. For more information go to www.thenutritionsource.org  Drink alcohol in moderation- Limit alcohol intake to less than two drinks a day. Never drink and drive.  Dentist- Brush and floss twice daily; visit your dentist twice a year.  Depression- Your emotional health is as important as your physical health. If you're feeling down, or losing interest in things you would normally enjoy please talk to your healthcare provider.  Eye exam- Visit your eye doctor every year.  Safe sex- If you may be exposed to a sexually transmitted infection, use a condom.  Seat belts- Seat belts can save your life; always wear one.  Smoke/Carbon Monoxide detectors- These detectors need to be installed on the appropriate  level of your home.  Replace batteries at least once a year.  Skin cancer- When out in the sun, cover up and use sunscreen 15 SPF or higher.  Violence- If anyone is threatening you, please tell your healthcare provider.  Living Will/ Health care power of attorney- Speak with your healthcare provider and family.

## 2017-05-15 NOTE — Progress Notes (Signed)
Dennis Orr  MRN: [id number] DOB: 11-12-1956  PCP: Mancel Bale, PA-C  Subjective:  Pt presents to clinic for a CPE.  -elevated cholesterol - he can only take statins for a short period of time and then he starts to hurt - he has been off them now at least a year - he has tried several of them and they all have the same problems over the time -HTN - no diagnosis but it has been borderline- he does not check at home/work -ulcerative colitis - no current problems  -insomnia - on unisom and it works -heartburn - occasional - he uses rolaids when he has it and they work  Last dental exam:  Last vision exam: upcoming this week  Last colonoscopy: 2017 Vaccinations - needs tetanus -   Typical meals for patient: 2 meals - during the week fast food - weekends fresh veggies made at home Typical beverage choices: Exercises: active at work - works in yard at home Sleeps: sleeping well 8 hrs per night  Patient Active Problem List   Diagnosis Date Noted  . Elevated cholesterol 05/15/2017  . Elevated BP without diagnosis of hypertension 05/15/2017  . History of sleep apnea 05/15/2017  . Low bone density 03/20/2016  . Vitamin D deficiency 03/10/2016  . Hx of adenomatous colonic polyps 01/13/2016  . Barrett's esophagus - NOT - egd 12/2015 no changes 12/09/2015  . Ulcerative colitis ([place]) 07/29/2015    Review of Systems  Constitutional: Negative.   HENT: Negative.   Eyes: Negative.   Respiratory: Negative.   Cardiovascular: Negative.   Gastrointestinal: Negative.   Endocrine: Negative.   Genitourinary: Negative.   Musculoskeletal: Negative.   Skin: Negative.   Allergic/Immunologic: Negative.   Neurological: Negative.   Hematological: Negative.   Psychiatric/Behavioral: Negative.      No current outpatient prescriptions on file prior to visit.   No current facility-administered medications on file prior to visit.     No Known Allergies  Social History   Social  History  . Marital status: Single    Spouse name: N/A  . Number of children: N/A  . Years of education: N/A   Occupational History  . [place] History Main Topics  . Smoking status: Former Smoker    Packs/day: 0.50    Years: 10.00    Quit date: 11/14/1994  . Smokeless tobacco: Never Used  . Alcohol use 10.8 oz/week    18 Standard drinks or equivalent per week     Comment: up to 18 beers a week - 3-4 a night on nights that he drinks  . Drug use: No  . Sexual activity: Yes    Partners: Female   Other Topics Concern  . None   Social History Narrative   Single - self-employed HVAC business   4 EtOH/day   1 caffeine/day    Past Surgical History:  Procedure Laterality Date  . CHOLECYSTECTOMY N/A   . COLONOSCOPY    . TONSILLECTOMY    . UPPER GASTROINTESTINAL ENDOSCOPY      Family History  Problem Relation Age of Onset  . Colon cancer Mother   . Breast cancer Sister   . Hyperlipidemia Sister   . Colon polyps Neg Hx   . Esophageal cancer Neg Hx   . Stomach cancer Neg Hx   . Rectal cancer Neg Hx      Objective:  BP (!) 170/88   Pulse 94   Temp 99.2 F (37.3 C) (  Oral)   Resp 18   Ht 5' 6.53" (1.69 m)   Wt 200 lb 12.8 oz (91.1 kg)   SpO2 98%   BMI 31.89 kg/m   Physical Exam  Constitutional: He is oriented to person, place, and time and well-developed, well-nourished, and in no distress.  HENT:  Head: Normocephalic and atraumatic.  Right Ear: Hearing, tympanic membrane, external ear and ear canal normal.  Left Ear: Hearing, tympanic membrane, external ear and ear canal normal.  Nose: Nose normal.  Mouth/Throat: Uvula is midline, oropharynx is clear and moist and mucous membranes are normal.  Eyes: Conjunctivae and EOM are normal. Pupils are equal, round, and reactive to light.  Neck: Trachea normal and normal range of motion. Neck supple. No thyroid mass and no thyromegaly present.  Cardiovascular: Normal rate, regular rhythm and normal  heart sounds.   No murmur heard. Pulmonary/Chest: Effort normal and breath sounds normal.  Abdominal: Soft. Bowel sounds are normal. Hernia confirmed negative in the right inguinal area and confirmed negative in the left inguinal area.  Genitourinary: Rectum normal, testes/scrotum normal and penis normal. Prostate is enlarged (mild). Prostate is not tender.  Musculoskeletal: Normal range of motion.  Neurological: He is alert and oriented to person, place, and time. Gait normal.  Skin: Skin is warm and dry.  Psychiatric: Mood, memory, affect and judgment normal.    Wt Readings from Last 3 Encounters:  05/15/17 200 lb 12.8 oz (91.1 kg)  03/10/16 195 lb 2 oz (88.5 kg)  01/05/16 190 lb (86.2 kg)     Visual Acuity Screening   Right eye Left eye Both eyes  Without correction: _0  With correction:       Rhythm: NSR at a rate of 86. Findings: no acute change - WNL Last EKG: none   I have personally reviewed the EKG tracing and agree with the computerized printout.  Assessment and Plan :  Annual physical exam  Vitamin D deficiency - Plan: VITAMIN D 25 Hydroxy (Vit-D Deficiency, Fractures)  Ulcerative colitis without complications, unspecified location ([place]) - Plan: CBC with Differential/Platelet  Elevated cholesterol - Plan: CMP14+EGFR, Lipid panel  Screening for prostate cancer - Plan: PSA  Need for Tdap vaccination - Plan: CANCELED: Tdap vaccine greater than or equal to 7yo IM - not in stock will give at f/u in a month  Encounter for hepatitis C screening test for low risk patient - Plan: HCV Antibody RFX to Quant PCR  Screening for HIV (human immunodeficiency virus) - Plan: HIV antibody  Elevated BP without diagnosis of hypertension - Plan: CMP14+EGFR, TSH, EKG 12-Lead, chlorthalidone (HYGROTON) 25 MG tablet - d/w pt salt intake in regards to elevated BP  History of sleep apnea  Insomnia, unspecified type - Plan: zolpidem (AMBIEN) 5 MG tablet - this works  most night - he will monitor how often this does not work   Windell Hummingbird PA-C  [facility] 05/15/2017 12:02 PM

## 2017-05-16 LAB — CMP14+EGFR
A/G RATIO: 1.5 (ref 1.2–2.2)
ALBUMIN: 4.3 g/dL (ref 3.5–5.5)
ALT: 16 IU/L (ref 0–44)
AST: 14 IU/L (ref 0–40)
Alkaline Phosphatase: 66 IU/L (ref 39–117)
BILIRUBIN TOTAL: 0.4 mg/dL (ref 0.0–1.2)
BUN / CREAT RATIO: 18 (ref 9–20)
BUN: 16 mg/dL (ref 6–24)
CHLORIDE: 101 mmol/L (ref 96–106)
CO2: 21 mmol/L (ref 20–29)
Calcium: 9.7 mg/dL (ref 8.7–10.2)
Creatinine, Ser: 0.9 mg/dL (ref 0.76–1.27)
GFR, EST AFRICAN AMERICAN: 108 mL/min/{1.73_m2} (ref >59)
GFR, EST NON AFRICAN AMERICAN: 93 mL/min/{1.73_m2} (ref >59)
GLOBULIN, TOTAL: 2.8 g/dL (ref 1.5–4.5)
Glucose: 92 mg/dL (ref 65–99)
POTASSIUM: 4.7 mmol/L (ref 3.5–5.2)
SODIUM: 140 mmol/L (ref 134–144)
TOTAL PROTEIN: 7.1 g/dL (ref 6.0–8.5)

## 2017-05-16 LAB — LIPID PANEL
CHOL/HDL RATIO: 3.8 ratio (ref 0.0–5.0)
Cholesterol, Total: 258 mg/dL — ABNORMAL HIGH (ref 100–199)
HDL: 68 mg/dL (ref >39)
LDL Calculated: 137 mg/dL — ABNORMAL HIGH (ref 0–99)
Triglycerides: 265 mg/dL — ABNORMAL HIGH (ref 0–149)
VLDL Cholesterol Cal: 53 mg/dL — ABNORMAL HIGH (ref 5–40)

## 2017-05-16 LAB — PSA: Prostate Specific Ag, Serum: 1.6 ng/mL (ref 0.0–4.0)

## 2017-05-16 LAB — CBC WITH DIFFERENTIAL/PLATELET
Basophils Absolute: 0 10*3/uL (ref 0.0–0.2)
Basos: 1 %
EOS (ABSOLUTE): 0.2 10*3/uL (ref 0.0–0.4)
Eos: 3 %
HEMOGLOBIN: 15.5 g/dL (ref 13.0–17.7)
Hematocrit: 44.6 % (ref 37.5–51.0)
IMMATURE GRANS (ABS): 0 10*3/uL (ref 0.0–0.1)
Immature Granulocytes: 0 %
LYMPHS: 29 %
Lymphocytes Absolute: 1.8 10*3/uL (ref 0.7–3.1)
MCH: 32.4 pg (ref 26.6–33.0)
MCHC: 34.8 g/dL (ref 31.5–35.7)
MCV: 93 fL (ref 79–97)
Monocytes Absolute: 0.4 10*3/uL (ref 0.1–0.9)
Monocytes: 6 %
Neutrophils Absolute: 3.8 10*3/uL (ref 1.4–7.0)
Neutrophils: 61 %
PLATELETS: 169 10*3/uL (ref 150–379)
RBC: 4.78 x10E6/uL (ref 4.14–5.80)
RDW: 14.5 % (ref 12.3–15.4)
WBC: 6.2 10*3/uL (ref 3.4–10.8)

## 2017-05-16 LAB — HIV ANTIBODY (ROUTINE TESTING W REFLEX): HIV Screen 4th Generation wRfx: NONREACTIVE

## 2017-05-16 LAB — TSH: TSH: 13.96 u[IU]/mL — AB (ref 0.450–4.500)

## 2017-05-16 LAB — HCV AB W REFLEX TO QUANT PCR: HCV Ab: <0.1 s/co ratio (ref 0.0–0.9)

## 2017-05-16 LAB — HCV INTERPRETATION

## 2017-05-16 LAB — VITAMIN D 25 HYDROXY (VIT D DEFICIENCY, FRACTURES): Vit D, 25-Hydroxy: 22.8 ng/mL — ABNORMAL LOW (ref 30.0–100.0)

## 2017-05-20 MED ORDER — SYNTHROID 25 MCG PO TABS: 25.0000 ug | ORAL_TABLET | Freq: Every day | ORAL | 0 refills | Status: DC

## 2017-05-20 NOTE — Addendum Note (Signed)
Addended by: Mancel Bale on: 05/20/2017 05:14 PM   Modules accepted: Orders

## 2017-05-21 LAB — T4, FREE: FREE T4: 0.92 ng/dL (ref 0.82–1.77)

## 2017-05-21 LAB — SPECIMEN STATUS REPORT

## 2017-05-22 ENCOUNTER — Encounter: Payer: Self-pay | Admitting: Physician Assistant

## 2017-05-24 ENCOUNTER — Other Ambulatory Visit: Payer: Self-pay | Admitting: Emergency Medicine

## 2017-05-24 MED ORDER — LEVOTHYROXINE SODIUM 25 MCG PO CAPS: 25.0000 ug | ORAL_CAPSULE | Freq: Every day | ORAL | 5 refills | Status: DC

## 2017-06-20 ENCOUNTER — Ambulatory Visit (INDEPENDENT_AMBULATORY_CARE_PROVIDER_SITE_OTHER): Payer: BC Managed Care – PPO | Admitting: Physician Assistant

## 2017-06-20 ENCOUNTER — Encounter: Payer: Self-pay | Admitting: Physician Assistant

## 2017-06-20 VITALS — BP 138/88 | HR 91 | Temp 98.9°F | Resp 18 | Ht 66.53 in | Wt 198.0 lb

## 2017-06-20 DIAGNOSIS — I10 Essential (primary) hypertension: Secondary | ICD-10-CM | POA: Diagnosis not present

## 2017-06-20 DIAGNOSIS — G4700 Insomnia, unspecified: Secondary | ICD-10-CM

## 2017-06-20 DIAGNOSIS — E039 Hypothyroidism, unspecified: Secondary | ICD-10-CM | POA: Diagnosis not present

## 2017-06-20 MED ORDER — CHLORTHALIDONE 25 MG PO TABS: 25.0000 mg | ORAL_TABLET | Freq: Every day | ORAL | 1 refills | Status: DC

## 2017-06-20 MED ORDER — ZOLPIDEM TARTRATE 10 MG PO TABS: 10.0000 mg | ORAL_TABLET | Freq: Every evening | ORAL | 5 refills | Status: DC | PRN

## 2017-06-20 NOTE — Progress Notes (Signed)
Dennis Orr  MRN: [id number] DOB: 1956-04-13  PCP: Mancel Bale, PA-C  Chief Complaint  Patient presents with  . Hypertension    BP was 157/101  pt has he has had a bad day     Subjective:  Pt presents to clinic for medication management.  Insomnia - having to use 2 ambein a night to sleep but when he does he sleeps really well - insurance will only pay for 45 pills ever 75 days - he is not sure what he will do when he can only sleep half the time - he does not worry about things just cannot fall asleep HTN - at home BP 130/80s - tolerating medications good Thyroid - tolerating medications good  History is obtained by patient.  Review of Systems  Constitutional: Negative for chills and fever.  Eyes: Negative for visual disturbance.  Respiratory: Negative for cough and shortness of breath.   Cardiovascular: Negative for chest pain, palpitations and leg swelling.  Neurological: Negative for dizziness, light-headedness and headaches.  Psychiatric/Behavioral: Positive for sleep disturbance.    Patient Active Problem List   Diagnosis Date Noted  . Insomnia 06/21/2017  . Elevated cholesterol 05/15/2017  . Elevated BP without diagnosis of hypertension 05/15/2017  . History of sleep apnea 05/15/2017  . Low bone density 03/20/2016  . Vitamin D deficiency 03/10/2016  . Hx of adenomatous colonic polyps 01/13/2016  . Barrett's esophagus - NOT - egd 12/2015 no changes 12/09/2015  . Ulcerative colitis ([place]) 07/29/2015    Current Outpatient Prescriptions on File Prior to Visit  Medication Sig Dispense Refill  . doxylamine, Sleep, (UNISOM) 25 MG tablet Take 25 mg by mouth at bedtime as needed.    . Levothyroxine Sodium 25 MCG CAPS Take 1 capsule (25 mcg total) by mouth daily before breakfast. 30 capsule 5   No current facility-administered medications on file prior to visit.     No Known Allergies  Past Medical History:  Diagnosis Date  . Allergy   . Barrett's esophagus     . GERD (gastroesophageal reflux disease)    occ and uses nexium for this prn only  . Hx of adenomatous colonic polyps 01/13/2016  . Hyperlipidemia   . Low bone density 03/20/2016  . Ulcerative colitis ([place])   . 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 Smoking status: Former Smoker    Packs/day: 0.50    Years: 10.00    Quit date: 11/14/1994  . Smokeless tobacco: Never Used  . Alcohol use 10.8 oz/week    18 Standard drinks or equivalent per week     Comment: up to 18 beers a week - 3-4 a night on nights that he drinks   family history includes Breast cancer in his sister; Colon cancer in his mother; Hyperlipidemia in his sister.     Objective:  BP 138/88   Pulse 91   Temp 98.9 F (37.2 C) (Oral)   Resp 18   Ht 5' 6.53" (1.69 m)   Wt 198 lb (89.8 kg)   SpO2 97%   BMI 31.45 kg/m  Body mass index is 31.45 kg/m.  Physical Exam  Constitutional: He is oriented to person, place, and time and well-developed, well-nourished, and in no distress.  HENT:  Head: Normocephalic and atraumatic.  Right Ear: External ear normal.  Left Ear: External ear normal.  Eyes: Conjunctivae are normal.  Neck: Normal range of motion.  Cardiovascular: Normal rate, regular rhythm, normal heart sounds and intact distal pulses.   Pulmonary/Chest: Effort normal and breath sounds normal. He has no wheezes.  Musculoskeletal:       Right lower leg: He exhibits no edema.       Left lower leg: He exhibits no edema.  Neurological: He is alert and oriented to person, place, and time. Gait normal.  Skin: Skin is warm and dry.  Psychiatric: Mood, memory, affect and judgment normal.    Assessment and Plan :  Essential hypertension - Plan: chlorthalidone (HYGROTON) 25 MG tablet - controlled on medication - would like tighter control but he has had a bad day and he  has not been sleeping due to insurance no covering nightly medications - he will continue to decrease his salt and continue to monitor his BP at home.  Insomnia, unspecified type - Plan: zolpidem (AMBIEN) 10 MG tablet - increase dose for better sleep at night - he will use his insurance benefits and then he will pay cash for the remainder  Acquired hypothyroidism - continue medications- recheck in 4-6 weeks for lab check.    Windell Hummingbird PA-C  [facility] 06/21/2017 7:43 PM

## 2017-06-20 NOTE — Patient Instructions (Signed)
     IF you received an x-ray today, you will receive an invoice from [facility]. Please contact [facility] at [phone] with questions or concerns regarding your invoice.   IF you received labwork today, you will receive an invoice from [facility]. Please contact [facility] at [phone] with questions or concerns regarding your invoice.   Our billing staff will not be able to assist you with questions regarding bills from these companies.  You will be contacted with the lab results as soon as they are available. The fastest way to get your results is to activate your My Chart account. Instructions are located on the last page of this paperwork. If you have not heard from us regarding the results in 2 weeks, please contact this office.     

## 2017-06-21 DIAGNOSIS — G4700 Insomnia, unspecified: Secondary | ICD-10-CM | POA: Insufficient documentation

## 2017-08-01 ENCOUNTER — Encounter: Payer: Self-pay | Admitting: Physician Assistant

## 2017-08-01 ENCOUNTER — Ambulatory Visit (INDEPENDENT_AMBULATORY_CARE_PROVIDER_SITE_OTHER): Payer: BC Managed Care – PPO | Admitting: Physician Assistant

## 2017-08-01 VITALS — BP 153/101 | HR 78 | Temp 97.5°F | Resp 18 | Ht 66.53 in | Wt 196.2 lb

## 2017-08-01 DIAGNOSIS — I10 Essential (primary) hypertension: Secondary | ICD-10-CM | POA: Diagnosis not present

## 2017-08-01 DIAGNOSIS — G4700 Insomnia, unspecified: Secondary | ICD-10-CM | POA: Diagnosis not present

## 2017-08-01 DIAGNOSIS — E039 Hypothyroidism, unspecified: Secondary | ICD-10-CM

## 2017-08-01 DIAGNOSIS — Z23 Encounter for immunization: Secondary | ICD-10-CM

## 2017-08-01 MED ORDER — LOSARTAN POTASSIUM 50 MG PO TABS: 50.0000 mg | ORAL_TABLET | Freq: Every day | ORAL | 0 refills | Status: DC

## 2017-08-01 NOTE — Progress Notes (Signed)
Dennis Orr  MRN: [id number] DOB: 1956-02-03  PCP: Mancel Bale, PA-C  Chief Complaint  Patient presents with  . Follow-up    stopped taking hygroton and levothyroxine sodium due to it causing pain     Subjective:  Pt presents to clinic for medication problems and medication management.    Chlorthalidone made him feel bad - muscle cramping. While he was on this medication he stated his BP has returned to a normal reading. Synthroid made him irritable - he stopped it for a few days and the symptoms went away and then he restarted and the symptoms returned so he stopped it 2 weeks ago.  History is obtained by patient.  Review of Systems  Constitutional: Negative for chills and fever.  Eyes: Negative for visual disturbance.  Respiratory: Negative for cough and shortness of breath.   Cardiovascular: Negative for chest pain, palpitations and leg swelling.  Neurological: Negative for dizziness, light-headedness and headaches.  Psychiatric/Behavioral: Positive for sleep disturbance (still not understanding how he needs to get his ambein from the pharmacy so he ran out and has not been sleeping well). The patient is nervous/anxious (stressful day at work).     Patient Active Problem List   Diagnosis Date Noted  . Insomnia 06/21/2017  . Elevated cholesterol 05/15/2017  . Elevated BP without diagnosis of hypertension 05/15/2017  . History of sleep apnea 05/15/2017  . Low bone density 03/20/2016  . Vitamin D deficiency 03/10/2016  . Hx of adenomatous colonic polyps 01/13/2016  . Barrett's esophagus - NOT - egd 12/2015 no changes 12/09/2015  . Ulcerative colitis ([place]) 07/29/2015    Current Outpatient Prescriptions on File Prior to Visit  Medication Sig Dispense Refill  . doxylamine, Sleep, (UNISOM) 25 MG tablet Take 25 mg by mouth at bedtime as needed.    . zolpidem (AMBIEN) 10 MG tablet Take 1 tablet (10 mg total) by mouth at bedtime as needed for sleep. 30 tablet 5   No  current facility-administered medications on file prior to visit.     No Known Allergies  Past Medical History:  Diagnosis Date  . Allergy   . Barrett's esophagus   . GERD (gastroesophageal reflux disease)    occ and uses nexium for this prn only  . Hx of adenomatous colonic polyps 01/13/2016  . Hyperlipidemia   . Low bone density 03/20/2016  . Ulcerative colitis ([place])   . 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 Smoking status: Former Smoker    Packs/day: 0.50    Years: 10.00    Quit date: 11/14/1994  . Smokeless tobacco: Never Used  . Alcohol use 10.8 oz/week    18 Standard drinks or equivalent per week     Comment: up to 18 beers a week - 3-4 a night on nights that he drinks   family history includes Breast cancer in his sister; Colon cancer in his mother; Hyperlipidemia in his sister.     Objective:  BP (!) 153/101   Pulse 78   Temp (!) 97.5 F (36.4 C) (Oral)   Resp 18   Ht 5' 6.53" (1.69 m)   Wt 196 lb 3.2 oz (89 kg)   SpO2 98%   BMI 31.16 kg/m  Body mass index is 31.16 kg/m.  Physical Exam  Constitutional: He is oriented to  person, place, and time and well-developed, well-nourished, and in no distress.  HENT:  Head: Normocephalic and atraumatic.  Right Ear: External ear normal.  Left Ear: External ear normal.  Eyes: Conjunctivae are normal.  Neck: Normal range of motion.  Cardiovascular: Normal rate, regular rhythm, normal heart sounds and intact distal pulses.   Pulmonary/Chest: Effort normal and breath sounds normal. He has no wheezes.  Musculoskeletal:       Right lower leg: He exhibits no edema.       Left lower leg: He exhibits no edema.  Neurological: He is alert and oriented to person, place, and time. Gait normal.  Skin: Skin is warm and dry.  Psychiatric: Mood, memory, affect and judgment normal.      Assessment and Plan :  Hypothyroidism, unspecified type - Plan: TSH, T4, Free - recheck labs - ? Early hashimoto's thyroiditis and his levels have returned to normal and with the additional medication he may have gotten to hyperthyroid state -   Need for influenza vaccination - Plan: Flu Vaccine QUAD 36+ mos IM  Essential hypertension - Plan: losartan (COZAAR) 50 MG tablet - change medication for BP control and hopefully less side effects  Insomnia, unspecified type - called pharmacy and d/w pt that he will have to pay for #15 Ambien a month due to insurance issues - d/w pt how to do this with good rx.  Windell Hummingbird PA-C  [facility] 08/01/2017 5:48 PM

## 2017-08-01 NOTE — Patient Instructions (Signed)
     IF you received an x-ray today, you will receive an invoice from [facility]. Please contact  Radiology at [phone] with questions or concerns regarding your invoice.   IF you received labwork today, you will receive an invoice from [facility]. Please contact [facility] at [phone] with questions or concerns regarding your invoice.   Our billing staff will not be able to assist you with questions regarding bills from these companies.  You will be contacted with the lab results as soon as they are available. The fastest way to get your results is to activate your My Chart account. Instructions are located on the last page of this paperwork. If you have not heard from us regarding the results in 2 weeks, please contact this office.     

## 2017-08-02 LAB — T4, FREE: FREE T4: 1.1 ng/dL (ref 0.82–1.77)

## 2017-08-02 LAB — TSH: TSH: 6.56 u[IU]/mL — ABNORMAL HIGH (ref 0.450–4.500)

## 2017-08-29 ENCOUNTER — Encounter: Payer: Self-pay | Admitting: Physician Assistant

## 2017-08-29 ENCOUNTER — Ambulatory Visit (INDEPENDENT_AMBULATORY_CARE_PROVIDER_SITE_OTHER): Payer: BC Managed Care – PPO | Admitting: Physician Assistant

## 2017-08-29 DIAGNOSIS — I10 Essential (primary) hypertension: Secondary | ICD-10-CM

## 2017-08-29 MED ORDER — LOSARTAN POTASSIUM 50 MG PO TABS: 50.0000 mg | ORAL_TABLET | Freq: Every day | ORAL | 0 refills | Status: DC

## 2017-08-29 NOTE — Progress Notes (Signed)
Dennis Orr  MRN: [id number] DOB: 10-01-1956  PCP: Mancel Bale, PA-C  Chief Complaint  Patient presents with  . Medication Refill    check up on meds     Subjective:  Pt presents to clinic for medication recheck.  He has been on the losartan for about a month and he is tolerating it ok - his BP at home has been in the 110s/70s.    History is obtained by patient.  Review of Systems  Constitutional: Negative for chills and fever.  Eyes: Negative for visual disturbance.  Respiratory: Negative for cough and shortness of breath.   Cardiovascular: Negative for chest pain, palpitations and leg swelling.  Neurological: Negative for dizziness, light-headedness and headaches.    Patient Active Problem List   Diagnosis Date Noted  . Insomnia 06/21/2017  . Elevated cholesterol 05/15/2017  . Elevated BP without diagnosis of hypertension 05/15/2017  . History of sleep apnea 05/15/2017  . Low bone density 03/20/2016  . Vitamin D deficiency 03/10/2016  . Hx of adenomatous colonic polyps 01/13/2016  . Barrett's esophagus - NOT - egd 12/2015 no changes 12/09/2015  . Ulcerative colitis ([place]) 07/29/2015    Current Outpatient Prescriptions on File Prior to Visit  Medication Sig Dispense Refill  . zolpidem (AMBIEN) 10 MG tablet Take 1 tablet (10 mg total) by mouth at bedtime as needed for sleep. 30 tablet 5   No current facility-administered medications on file prior to visit.     No Known Allergies  Past Medical History:  Diagnosis Date  . Allergy   . Barrett's esophagus   . GERD (gastroesophageal reflux disease)    occ and uses nexium for this prn only  . Hx of adenomatous colonic polyps 01/13/2016  . Hyperlipidemia   . Low bone density 03/20/2016  . Ulcerative colitis ([place])   . 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  Smoking status: Former Smoker    Packs/day: 0.50    Years: 10.00    Quit date: 11/14/1994  . Smokeless tobacco: Never Used  . Alcohol use 10.8 oz/week    18 Standard drinks or equivalent per week     Comment: up to 18 beers a week - 3-4 a night on nights that he drinks   family history includes Breast cancer in his sister; Colon cancer in his mother; Hyperlipidemia in his sister.     Objective:  BP (!) 142/80   Pulse 81   Temp 98.1 F (36.7 C) (Oral)   Resp 18   Ht 5' 6"  (1.676 m)   Wt 195 lb (88.5 kg)   SpO2 98%   BMI 31.47 kg/m  Body mass index is 31.47 kg/m.  Physical Exam  Constitutional: He is oriented to person, place, and time and well-developed, well-nourished, and in no distress.  HENT:  Head: Normocephalic and atraumatic.  Right Ear: External ear normal.  Left Ear: External ear normal.  Eyes: Conjunctivae are normal.  Neck: Normal range of motion.  Cardiovascular: Normal rate, regular rhythm and normal heart sounds.   No murmur heard. Pulmonary/Chest: Effort normal and breath sounds normal. He has no wheezes.  Neurological: He is alert and oriented to person, place, and time. Gait normal.  Skin: Skin is warm and dry.  Psychiatric: Mood, memory, affect and judgment normal.  Assessment and Plan :  Essential hypertension - Plan: losartan (COZAAR) 50 MG tablet  Well controlled - he is tolerating the medication - he will recheck in 3 months and continue to monitor his BP at home. The same dose will be continued.  Windell Hummingbird PA-C  [facility] 08/29/2017 5:27 PM

## 2017-08-29 NOTE — Patient Instructions (Signed)
     IF you received an x-ray today, you will receive an invoice from [facility]. Please contact [facility] at [phone] with questions or concerns regarding your invoice.   IF you received labwork today, you will receive an invoice from [facility]. Please contact [facility] at [phone] with questions or concerns regarding your invoice.   Our billing staff will not be able to assist you with questions regarding bills from these companies.  You will be contacted with the lab results as soon as they are available. The fastest way to get your results is to activate your My Chart account. Instructions are located on the last page of this paperwork. If you have not heard from us regarding the results in 2 weeks, please contact this office.     

## 2017-10-26 ENCOUNTER — Encounter: Payer: Self-pay | Admitting: Physician Assistant

## 2017-10-26 DIAGNOSIS — G4700 Insomnia, unspecified: Secondary | ICD-10-CM

## 2017-10-30 ENCOUNTER — Encounter: Payer: Self-pay | Admitting: Physician Assistant

## 2017-10-31 MED ORDER — ZOLPIDEM TARTRATE 10 MG PO TABS: 10.0000 mg | ORAL_TABLET | Freq: Every evening | ORAL | 0 refills | Status: DC | PRN

## 2017-11-20 ENCOUNTER — Telehealth: Payer: Self-pay | Admitting: Physician Assistant

## 2017-11-20 NOTE — Telephone Encounter (Signed)
Called pt to try and reschedule his appt with Windell Hummingbird from 11/28/17. She will be out of the office. Please reschedule pt when they can come in.  [name] will oly be working half days for a little bit but after 12/06/17 she is open.  Thanks!

## 2017-11-27 NOTE — Telephone Encounter (Signed)
Copied from [place] [phone]. Topic: General - Other >> Nov 27, 2017  5:51 PM Neva Seat wrote: Zolpidem 3m  2 pills left. Has check up with Dr. WGale Journeyon Feb. 5th  [place]-  [phone]                                                    Phone: [phone]

## 2017-11-28 ENCOUNTER — Ambulatory Visit: Payer: BC Managed Care – PPO | Admitting: Physician Assistant

## 2017-11-30 ENCOUNTER — Other Ambulatory Visit: Payer: Self-pay | Admitting: Physician Assistant

## 2017-11-30 DIAGNOSIS — G4700 Insomnia, unspecified: Secondary | ICD-10-CM

## 2017-12-01 ENCOUNTER — Other Ambulatory Visit: Payer: Self-pay | Admitting: Physician Assistant

## 2017-12-01 DIAGNOSIS — G4700 Insomnia, unspecified: Secondary | ICD-10-CM

## 2017-12-01 NOTE — Telephone Encounter (Signed)
Pt requesting refill on [place]

## 2017-12-02 ENCOUNTER — Encounter: Payer: Self-pay | Admitting: Physician Assistant

## 2017-12-04 NOTE — Telephone Encounter (Signed)
Patient is requesting a refill of the following medications: Requested Prescriptions   Pending Prescriptions Disp Refills  . zolpidem (AMBIEN) 10 MG tablet 30 tablet 0    Sig: Take 1 tablet (10 mg total) by mouth at bedtime as needed for sleep.    Date of patient request: 12/01/2017 Last office visit: 08/01/2017 Date of last refill: 10/31/2017 Last refill amount: 0 Follow up time period per chart: February 2019, has appointment 12/19/17.   Provider, please advise.

## 2017-12-05 MED ORDER — ZOLPIDEM TARTRATE 10 MG PO TABS: 10.0000 mg | ORAL_TABLET | Freq: Every evening | ORAL | 2 refills | Status: DC | PRN

## 2017-12-05 NOTE — Telephone Encounter (Signed)
Done

## 2017-12-05 NOTE — Telephone Encounter (Signed)
Duplicate

## 2017-12-19 ENCOUNTER — Encounter: Payer: Self-pay | Admitting: Physician Assistant

## 2017-12-19 ENCOUNTER — Other Ambulatory Visit: Payer: Self-pay

## 2017-12-19 ENCOUNTER — Ambulatory Visit (INDEPENDENT_AMBULATORY_CARE_PROVIDER_SITE_OTHER): Payer: BC Managed Care – PPO | Admitting: Physician Assistant

## 2017-12-19 VITALS — BP 160/90 | HR 78 | Temp 98.5°F | Resp 18 | Ht 66.0 in | Wt 196.2 lb

## 2017-12-19 DIAGNOSIS — F5101 Primary insomnia: Secondary | ICD-10-CM | POA: Diagnosis not present

## 2017-12-19 DIAGNOSIS — R7989 Other specified abnormal findings of blood chemistry: Secondary | ICD-10-CM | POA: Diagnosis not present

## 2017-12-19 DIAGNOSIS — I10 Essential (primary) hypertension: Secondary | ICD-10-CM

## 2017-12-19 MED ORDER — ZOLPIDEM TARTRATE ER 6.25 MG PO TBCR: 6.2500 mg | EXTENDED_RELEASE_TABLET | Freq: Every evening | ORAL | 0 refills | Status: DC | PRN

## 2017-12-19 NOTE — Progress Notes (Signed)
Dennis Orr  MRN: [id number] DOB: 08/15/1956  PCP: Mancel Bale, PA-C  Chief Complaint  Patient presents with  . Hypertension    follow up     Subjective:  Pt presents to clinic for medication refills. He did stop his cozaar because he thought it might be causing him back and joint pain and when he stopped the medication the symptoms did improve.  He restarted it about 4 weeks ago and currently he is having no problems but he is aware that his SE started after he had been on it for about 7 weeks.  He always has problems with side effects but he is willing to try again.  Checks BP at home - typically runs 130-140/ 80s but when he checks it at work it is typically much higher - he does admit that his cuff is greater than 93 years old.    He has started to have different problems sleeping.  His Lorrin Mais is still getting him to sleep but for only about 4 hours and then he wakes up and cannot get to sleep again so he lays awake for 2-3 hours before he has to get up.  He has never been on Ambien CR in the past.  History is obtained by p.atient  Review of Systems  Constitutional: Negative for chills and fever.  Eyes: Negative for visual disturbance.  Respiratory: Negative for cough and shortness of breath.   Cardiovascular: Negative for chest pain, palpitations and leg swelling.  Musculoskeletal: Positive for myalgias (currently resolved but he thinks he got it from the HTN medication).  Neurological: Negative for dizziness, light-headedness and headaches.  Psychiatric/Behavioral: Positive for sleep disturbance.    Patient Active Problem List   Diagnosis Date Noted  . Insomnia 06/21/2017  . Elevated cholesterol 05/15/2017  . Elevated BP without diagnosis of hypertension 05/15/2017  . History of sleep apnea 05/15/2017  . Low bone density 03/20/2016  . Vitamin D deficiency 03/10/2016  . Hx of adenomatous colonic polyps 01/13/2016  . Barrett's esophagus - NOT - egd 12/2015 no changes  12/09/2015  . Ulcerative colitis ([place]) 07/29/2015    Current Outpatient Medications on File Prior to Visit  Medication Sig Dispense Refill  . losartan (COZAAR) 50 MG tablet Take 1 tablet (50 mg total) by mouth daily. 90 tablet 0  . zolpidem (AMBIEN) 10 MG tablet Take 1 tablet (10 mg total) by mouth at bedtime as needed for sleep. 30 tablet 2   No current facility-administered medications on file prior to visit.     No Known Allergies  Past Medical History:  Diagnosis Date  . Allergy   . Barrett's esophagus   . GERD (gastroesophageal reflux disease)    occ and uses nexium for this prn only  . Hx of adenomatous colonic polyps 01/13/2016  . Hyperlipidemia   . Low bone density 03/20/2016  . Ulcerative colitis ([place])   . 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 Smoking status: Former Smoker    Packs/day: 0.50    Years: 10.00    Pack years: 5.00    Last attempt to quit: 11/14/1994    Years since quitting: 23.1  . Smokeless tobacco: Never Used  Substance Use Topics  . Alcohol use: Yes    Alcohol/week: 10.8 oz    Types: 18 Standard drinks  or equivalent per week    Comment: up to 18 beers a week - 3-4 a night on nights that he drinks  . Drug use: No   family history includes Breast cancer in his sister; Colon cancer in his mother; Hyperlipidemia in his sister.     Objective:  BP (!) 160/90   Pulse 78   Temp 98.5 F (36.9 C) (Oral)   Resp 18   Ht 5' 6"  (1.676 m)   Wt 196 lb 3.2 oz (89 kg)   SpO2 98%   BMI 31.67 kg/m  Body mass index is 31.67 kg/m.  Physical Exam  Constitutional: He is oriented to person, place, and time and well-developed, well-nourished, and in no distress.  HENT:  Head: Normocephalic and atraumatic.  Right Ear: External ear normal.  Left Ear: External ear normal.  Eyes: Conjunctivae are normal.  Neck:  Normal range of motion.  Cardiovascular: Normal rate, regular rhythm, normal heart sounds and intact distal pulses.  Pulmonary/Chest: Effort normal and breath sounds normal. He has no wheezes.  Musculoskeletal:       Right lower leg: He exhibits no edema.       Left lower leg: He exhibits no edema.  Neurological: He is alert and oriented to person, place, and time. Gait normal.  Skin: Skin is warm and dry.  Psychiatric: Mood, memory, affect and judgment normal.    Assessment and Plan :  Essential hypertension - Plan: CMP14+EGFR - for now continue Cozaar - if his muscle pain continues we will consider Norvasc - his BP today is elevated but I am concerned about increasing the dose because he believes his had side effects - he will bring in his BP cuff to his next appt to see if his home BP are accurate.  If he tolerated the cozaar and his BP is still high next month we will increase the dose.  Elevated TSH - Plan: TSH - recheck - possible that this could be affecting his BP - he was unable to tolerate the Synthroid - we will decide next treatment step based on his lab results  Primary insomnia - Plan: zolpidem (AMBIEN CR) 6.25 MG CR tablet - change to CR ambien to see if that helps with his staying asleep.  Windell Hummingbird PA-C  [facility] 12/19/2017 5:07 PM

## 2017-12-19 NOTE — Patient Instructions (Signed)
     IF you received an x-ray today, you will receive an invoice from [facility]. Please contact [facility] at [phone] with questions or concerns regarding your invoice.   IF you received labwork today, you will receive an invoice from [facility]. Please contact [facility] at [phone] with questions or concerns regarding your invoice.   Our billing staff will not be able to assist you with questions regarding bills from these companies.  You will be contacted with the lab results as soon as they are available. The fastest way to get your results is to activate your My Chart account. Instructions are located on the last page of this paperwork. If you have not heard from us regarding the results in 2 weeks, please contact this office.     

## 2017-12-20 LAB — CMP14+EGFR
A/G RATIO: 1.6 (ref 1.2–2.2)
ALT: 29 IU/L (ref 0–44)
AST: 22 IU/L (ref 0–40)
Albumin: 4.6 g/dL (ref 3.6–4.8)
Alkaline Phosphatase: 59 IU/L (ref 39–117)
BUN/Creatinine Ratio: 16 (ref 10–24)
BUN: 12 mg/dL (ref 8–27)
Bilirubin Total: 0.4 mg/dL (ref 0.0–1.2)
CALCIUM: 10.2 mg/dL (ref 8.6–10.2)
CO2: 22 mmol/L (ref 20–29)
Chloride: 104 mmol/L (ref 96–106)
Creatinine, Ser: 0.77 mg/dL (ref 0.76–1.27)
GFR, EST AFRICAN AMERICAN: 114 mL/min/{1.73_m2} (ref >59)
GFR, EST NON AFRICAN AMERICAN: 99 mL/min/{1.73_m2} (ref >59)
GLOBULIN, TOTAL: 2.8 g/dL (ref 1.5–4.5)
Glucose: 94 mg/dL (ref 65–99)
Potassium: 4.5 mmol/L (ref 3.5–5.2)
SODIUM: 142 mmol/L (ref 134–144)
TOTAL PROTEIN: 7.4 g/dL (ref 6.0–8.5)

## 2017-12-20 LAB — THYROID PANEL WITH TSH
Free Thyroxine Index: 1.2 (ref 1.2–4.9)
T3 UPTAKE RATIO: 24 % (ref 24–39)
T4 TOTAL: 5.1 ug/dL (ref 4.5–12.0)
TSH: 9.23 u[IU]/mL — ABNORMAL HIGH (ref 0.450–4.500)

## 2017-12-23 ENCOUNTER — Other Ambulatory Visit: Payer: Self-pay | Admitting: Physician Assistant

## 2017-12-23 DIAGNOSIS — I10 Essential (primary) hypertension: Secondary | ICD-10-CM

## 2018-01-02 ENCOUNTER — Encounter: Payer: Self-pay | Admitting: Physician Assistant

## 2018-01-02 ENCOUNTER — Encounter: Payer: Self-pay | Admitting: Internal Medicine

## 2018-01-16 ENCOUNTER — Other Ambulatory Visit: Payer: Self-pay

## 2018-01-16 ENCOUNTER — Ambulatory Visit: Payer: BC Managed Care – PPO | Admitting: Physician Assistant

## 2018-01-16 ENCOUNTER — Encounter: Payer: Self-pay | Admitting: Physician Assistant

## 2018-01-16 VITALS — BP 150/90 | HR 85 | Temp 98.2°F | Resp 18 | Ht 66.0 in | Wt 194.6 lb

## 2018-01-16 DIAGNOSIS — I10 Essential (primary) hypertension: Secondary | ICD-10-CM | POA: Diagnosis not present

## 2018-01-16 DIAGNOSIS — M791 Myalgia, unspecified site: Secondary | ICD-10-CM

## 2018-01-16 DIAGNOSIS — F5101 Primary insomnia: Secondary | ICD-10-CM | POA: Diagnosis not present

## 2018-01-16 MED ORDER — OLMESARTAN MEDOXOMIL 20 MG PO TABS: 20.0000 mg | ORAL_TABLET | Freq: Every day | ORAL | 0 refills | Status: DC

## 2018-01-16 NOTE — Patient Instructions (Signed)
     IF you received an x-ray today, you will receive an invoice from [facility]. Please contact [facility] at [phone] with questions or concerns regarding your invoice.   IF you received labwork today, you will receive an invoice from [facility]. Please contact [facility] at [phone] with questions or concerns regarding your invoice.   Our billing staff will not be able to assist you with questions regarding bills from these companies.  You will be contacted with the lab results as soon as they are available. The fastest way to get your results is to activate your My Chart account. Instructions are located on the last page of this paperwork. If you have not heard from us regarding the results in 2 weeks, please contact this office.     

## 2018-01-16 NOTE — Progress Notes (Signed)
Dennis Orr  MRN: [id number] DOB: 07-28-1956  PCP: Mancel Bale, PA-C  Chief Complaint  Patient presents with  . Hypertension    and insomnia follow up     Subjective:  Pt presents to clinic for medication recheck.  He has had sleep studies in years past- sleep apnea - has a CPAP in his closet but he does not use it - he had a full face mask and could not tolerate it.   Home BP readings - he takes in the am and pm -- am reading are consistently lower than in the pm.  He has been taking his medication but he is worried about the current recall.  He also thinks that the medication is causing him to ache.  He does note that some days he feels fine.  He does not sleep good most nights and wakes up 2-3 hours earlier than he is supposed to - he is not sure if his achy is related to that.  He does feel like the Ambein CR helped him sleep a little later.  He is not waking up having to use the restroom or from anything else he can identify.  Left arm 180/104 his cuff and manual 170/98 Right arm 170/100 his cuff manual 140/90  History is obtained by patient.  Review of Systems  Constitutional: Negative for chills and fever.  Eyes: Negative for visual disturbance.  Respiratory: Negative for cough and shortness of breath.   Cardiovascular: Negative for chest pain, palpitations and leg swelling.  Musculoskeletal: Positive for myalgias.  Neurological: Negative for dizziness, light-headedness and headaches.  Psychiatric/Behavioral: Positive for sleep disturbance.    Patient Active Problem List   Diagnosis Date Noted  . Insomnia 06/21/2017  . Elevated cholesterol 05/15/2017  . Elevated BP without diagnosis of hypertension 05/15/2017  . History of sleep apnea 05/15/2017  . Low bone density 03/20/2016  . Vitamin D deficiency 03/10/2016  . Hx of adenomatous colonic polyps 01/13/2016  . Barrett's esophagus - NOT - egd 12/2015 no changes 12/09/2015  . Ulcerative colitis ([place]) 07/29/2015      Current Outpatient Medications on File Prior to Visit  Medication Sig Dispense Refill  . zolpidem (AMBIEN CR) 6.25 MG CR tablet Take 1 tablet (6.25 mg total) by mouth at bedtime as needed for sleep. 30 tablet 0  . zolpidem (AMBIEN) 10 MG tablet Take 1 tablet (10 mg total) by mouth at bedtime as needed for sleep. 30 tablet 2   No current facility-administered medications on file prior to visit.     No Known Allergies  Past Medical History:  Diagnosis Date  . Allergy   . Barrett's esophagus   . GERD (gastroesophageal reflux disease)    occ and uses nexium for this prn only  . Hx of adenomatous colonic polyps 01/13/2016  . Hyperlipidemia   . Low bone density 03/20/2016  . Ulcerative colitis ([place])   . 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 Smoking status: Former Smoker    Packs/day: 0.50    Years: 10.00    Pack years: 5.00    Last attempt to quit: 11/14/1994    Years since quitting: 23.1  . Smokeless tobacco: Never Used  Substance Use Topics  . Alcohol use: Yes    Alcohol/week: 10.8 oz    Types: 18  Standard drinks or equivalent per week    Comment: up to 18 beers a week - 3-4 a night on nights that he drinks  . Drug use: No   family history includes Breast cancer in his sister; Colon cancer in his mother; Hyperlipidemia in his sister.     Objective:  BP (!) 150/90   Pulse 85   Temp 98.2 F (36.8 C) (Oral)   Resp 18   Ht 5' 6"  (1.676 m)   Wt 194 lb 9.6 oz (88.3 kg)   SpO2 98%   BMI 31.41 kg/m  Body mass index is 31.41 kg/m.  Physical Exam  Constitutional: He is oriented to person, place, and time and well-developed, well-nourished, and in no distress.  HENT:  Head: Normocephalic and atraumatic.  Right Ear: External ear normal.  Left Ear: External ear normal.  Eyes: Conjunctivae are normal.  Neck: Normal range of  motion.  Cardiovascular: Normal rate, regular rhythm, normal heart sounds and intact distal pulses.  Pulmonary/Chest: Effort normal and breath sounds normal. He has no wheezes.  Musculoskeletal:       Right lower leg: He exhibits no edema.       Left lower leg: He exhibits no edema.  Neurological: He is alert and oriented to person, place, and time. Gait normal.  Skin: Skin is warm and dry.  Psychiatric: Mood, memory, affect and judgment normal.    Assessment and Plan :  Essential hypertension - Plan: olmesartan (BENICAR) 20 MG tablet - do to recall and not good control switch to different medication.  Pt to continue to check BP at home - his cuff seems to be pretty accurate.  Primary insomnia - Plan: zolpidem (AMBIEN CR) 12.5 MG CR tablet - plan to increase this to see if he can get longer sleep - I suspect that his myalgias have more to do with his sleep and active job than it does to his medications.    Myalgias -  I suspect that his myalgias have more to do with his sleep and active job than it does to his medications.    Windell Hummingbird PA-C  [facility] 01/17/2018 8:40 AM

## 2018-01-17 ENCOUNTER — Telehealth: Payer: Self-pay | Admitting: *Deleted

## 2018-01-17 ENCOUNTER — Encounter: Payer: Self-pay | Admitting: Physician Assistant

## 2018-01-17 MED ORDER — ZOLPIDEM TARTRATE ER 12.5 MG PO TBCR: 12.5000 mg | EXTENDED_RELEASE_TABLET | Freq: Every evening | ORAL | 0 refills | Status: DC | PRN

## 2018-01-17 NOTE — Telephone Encounter (Signed)
Pt needs to be able to have his CPAP machine updated - he likely needs a titration and new face stuff - he current has a face mask but does not tolerate it.  He has the machine but there is no name of a company that he got it from.  He had his sleep study at [facility] and he signed a medical release to get [place] this information.

## 2018-01-17 NOTE — Telephone Encounter (Signed)
Dennis Orr the patient would have to titrate again since it has been so long.

## 2018-01-19 ENCOUNTER — Encounter: Payer: Self-pay | Admitting: Physician Assistant

## 2018-01-19 DIAGNOSIS — G4733 Obstructive sleep apnea (adult) (pediatric): Secondary | ICD-10-CM

## 2018-01-19 NOTE — Telephone Encounter (Signed)
I have let the patient know.

## 2018-02-12 ENCOUNTER — Other Ambulatory Visit: Payer: Self-pay | Admitting: Physician Assistant

## 2018-02-12 DIAGNOSIS — I10 Essential (primary) hypertension: Secondary | ICD-10-CM

## 2018-02-14 ENCOUNTER — Telehealth: Payer: Self-pay | Admitting: Physician Assistant

## 2018-02-14 NOTE — Telephone Encounter (Signed)
MyChart message sent to pt about changing him to [facility] per [place] request since she is out of the office Friday 4/5

## 2018-02-16 ENCOUNTER — Ambulatory Visit: Payer: BC Managed Care – PPO | Admitting: Physician Assistant

## 2018-02-16 ENCOUNTER — Encounter: Payer: Self-pay | Admitting: Physician Assistant

## 2018-02-16 ENCOUNTER — Other Ambulatory Visit: Payer: Self-pay

## 2018-02-16 DIAGNOSIS — I10 Essential (primary) hypertension: Secondary | ICD-10-CM

## 2018-02-16 DIAGNOSIS — F5101 Primary insomnia: Secondary | ICD-10-CM | POA: Diagnosis not present

## 2018-02-16 MED ORDER — OLMESARTAN MEDOXOMIL 20 MG PO TABS: 20.0000 mg | ORAL_TABLET | Freq: Every day | ORAL | 3 refills | Status: DC

## 2018-02-16 MED ORDER — ZOLPIDEM TARTRATE ER 12.5 MG PO TBCR: 12.5000 mg | EXTENDED_RELEASE_TABLET | Freq: Every evening | ORAL | 3 refills | Status: DC | PRN

## 2018-02-16 MED ORDER — CHLORTHALIDONE 25 MG PO TABS: 12.5000 mg | ORAL_TABLET | Freq: Every day | ORAL | 3 refills | Status: DC

## 2018-02-16 NOTE — Progress Notes (Signed)
02/16/2018 5:18 PM   DOB: 1956-10-18 / MRN: [id number]  SUBJECTIVE:  Dennis Orr is a 62 y.o. male presenting for recheck HTN.  Pressures have improved since starting olmesartan.  He is about to run out of the medication and could not get into [facility] before running out.   Has a history of poor sleep.  Dennis Orr provides him with Ambien which is somewhat helpful.  He has a sleep apnea eval coming up in the next two months.   He has No Known Allergies.   He  has a past medical history of Allergy, Barrett's esophagus, GERD (gastroesophageal reflux disease), adenomatous colonic polyps (01/13/2016), Hyperlipidemia, Low bone density (03/20/2016), Ulcerative colitis ([place]), and Vitamin D deficiency (03/10/2016).    He  reports that he quit smoking about 23 years ago. He has a 5.00 pack-year smoking history. He has never used smokeless tobacco. He reports that he drinks about 10.8 oz of alcohol per week. He reports that he does not use drugs. He  reports that he currently engages in sexual activity and has had partners who are Male. The patient  has a past surgical history that includes Cholecystectomy (N/A); Colonoscopy; Tonsillectomy; and Upper gastrointestinal endoscopy.  His family history includes Breast cancer in his sister; Colon cancer in his mother; Hyperlipidemia in his sister.  Review of Systems  Constitutional: Negative for chills, diaphoresis and fever.  Eyes: Negative.   Respiratory: Negative for shortness of breath.   Cardiovascular: Negative for chest pain, orthopnea and leg swelling.  Gastrointestinal: Negative for abdominal pain, blood in stool, constipation, diarrhea, heartburn, melena, nausea and vomiting.  Genitourinary: Negative for flank pain.  Skin: Negative for rash.  Neurological: Negative for dizziness, sensory change, speech change, focal weakness and headaches.    The problem list and medications were reviewed and updated by myself where necessary and exist  elsewhere in the encounter.   OBJECTIVE:  BP (!) 150/81 (BP Location: Left Arm, Patient Position: Sitting, Cuff Size: Normal)   Pulse 73   Temp 97.9 F (36.6 C) (Oral)   Ht 5' 7"  (1.702 m)   Wt 194 lb 3.2 oz (88.1 kg)   SpO2 97%   BMI 30.42 kg/m     Physical Exam  Constitutional: He appears well-developed. He is active and cooperative.  Non-toxic appearance.  Cardiovascular: Normal rate, regular rhythm, S1 normal, S2 normal, normal heart sounds, intact distal pulses and normal pulses. Exam reveals no gallop and no friction rub.  No murmur heard. Pulmonary/Chest: Effort normal. No stridor. No tachypnea. No respiratory distress. He has no wheezes. He has no rales.  Abdominal: He exhibits no distension.  Musculoskeletal: He exhibits no edema.  Neurological: He is alert.  Skin: Skin is warm and dry. He is not diaphoretic. No pallor.  Vitals reviewed.   Lab Results  Component Value Date   CREATININE 0.77 12/19/2017   Lab Results  Component Value Date   K 4.5 12/19/2017      No results found for this or any previous visit (from the past 72 hour(s)).  No results found.  ASSESSMENT AND PLAN:  Dennis Orr was seen today for hypertension.  Diagnoses and all orders for this visit:  Essential hypertension: Will start him on medium dose chlorthalidone for improved control.  He will come back and see Dennis Orr for recheck in one month. -     olmesartan (BENICAR) 20 MG tablet; Take 1 tablet (20 mg total) by mouth daily. -  chlorthalidone (HYGROTON) 25 MG tablet; Take 0.5 tablets (12.5 mg total) by mouth daily. -     Renal Function Panel  Primary insomnia -     zolpidem (AMBIEN CR) 12.5 MG CR tablet; Take 1 tablet (12.5 mg total) by mouth at bedtime as needed for sleep.    The patient is advised to call or return to clinic if he does not see an improvement in symptoms, or to seek the care of the closest emergency department if he worsens with the above plan.   Dennis Orr,  MHS, Dennis-C [facility] 02/16/2018 5:18 PM

## 2018-02-16 NOTE — Patient Instructions (Addendum)
  Come back to see Windell Hummingbird in about 1 month.    IF you received an x-ray today, you will receive an invoice from [facility]. Please contact [facility] at [phone] with questions or concerns regarding your invoice.   IF you received labwork today, you will receive an invoice from [facility]. Please contact [facility] at [phone] with questions or concerns regarding your invoice.   Our billing staff will not be able to assist you with questions regarding bills from these companies.  You will be contacted with the lab results as soon as they are available. The fastest way to get your results is to activate your My Chart account. Instructions are located on the last page of this paperwork. If you have not heard from [place] regarding the results in 2 weeks, please contact this office.

## 2018-02-17 LAB — RENAL FUNCTION PANEL
Albumin: 4.3 g/dL (ref 3.6–4.8)
BUN / CREAT RATIO: 13 (ref 10–24)
BUN: 11 mg/dL (ref 8–27)
CHLORIDE: 105 mmol/L (ref 96–106)
CO2: 20 mmol/L (ref 20–29)
Calcium: 9.3 mg/dL (ref 8.6–10.2)
Creatinine, Ser: 0.88 mg/dL (ref 0.76–1.27)
GFR calc Af Amer: 108 mL/min/{1.73_m2} (ref >59)
GFR calc non Af Amer: 93 mL/min/{1.73_m2} (ref >59)
GLUCOSE: 97 mg/dL (ref 65–99)
POTASSIUM: 4.4 mmol/L (ref 3.5–5.2)
Phosphorus: 3.3 mg/dL (ref 2.5–4.5)
SODIUM: 141 mmol/L (ref 134–144)

## 2018-03-22 ENCOUNTER — Encounter: Payer: Self-pay | Admitting: Neurology

## 2018-03-22 ENCOUNTER — Ambulatory Visit: Payer: BC Managed Care – PPO | Admitting: Neurology

## 2018-03-22 VITALS — BP 118/76 | HR 93 | Ht 67.0 in | Wt 189.5 lb

## 2018-03-22 DIAGNOSIS — G4733 Obstructive sleep apnea (adult) (pediatric): Secondary | ICD-10-CM | POA: Diagnosis not present

## 2018-03-22 DIAGNOSIS — R351 Nocturia: Secondary | ICD-10-CM | POA: Diagnosis not present

## 2018-03-22 DIAGNOSIS — G479 Sleep disorder, unspecified: Secondary | ICD-10-CM | POA: Diagnosis not present

## 2018-03-22 DIAGNOSIS — E663 Overweight: Secondary | ICD-10-CM

## 2018-03-22 NOTE — Progress Notes (Signed)
Subjective:    Patient ID: Dennis Orr is a 62 y.o. male.  HPI     Star Age, MD, PhD [facility] [address], [address] P.O. [place],  [address]  Dear [name],   I saw your patient, Dennis Orr, upon your kind request, in my neurologic clinic today for initial consultation of his sleep disorder, in particular, reevaluation of his prior diagnosis of obstructive sleep apnea. The patient is unaccompanied today. As you know, Dennis Orr is a 62 year old right-handed gentleman with an underlying medical history of allergies, reflux disease, vitamin D deficiency, ulcerative colitis, hypertension, hyperlipidemia, and obesity, who was previously diagnosed with obstructive sleep apnea and placed on CPAP therapy. He no longer uses a CPAP. He had sleep study testing at [facility] sleep center in 2011. He had a baseline sleep study on 12/09/2009 which showed severe obstructive sleep apnea with a total AHI of 71.6 per hour, O2 nadir of 80%. He had a CPAP titration study on 01/01/2016. I reviewed your office note from 01/16/2018. He reports difficulty falling asleep and staying asleep. His Epworth sleepiness score is 5 out of 24, fatigue score is 32 out of 63. He is single, lives alone, works for [employer] system. He quit smoking in 1995, drinks about 12 beers per week and Caffeine in the form of coffee, 2 cups per day on average.  His biggest sleep-related complaint is difficulty going to sleep and has required medication for this. He has been on Ambien for years. He's currently taking long-acting Ambien 12.5 mg each night. He works in Estate manager/land agent. Bedtime is between 9 and 11 at the latest, rise time around 6. He is not aware of any family history of obstructive sleep apnea. He had a tonsillectomy as a child. Weight has been fairly stable. He has nocturia about once per average night and denies morning headaches with the exception of sinus headaches from time  to time. He had difficulty with CPAP in the past, has not used it for 5 or 6 years. He had difficulty tolerating the full facemask as well as the pressure. The pressure was reduced one time as he recalls that he could still not tolerated. He would be willing to get checked out for sleep apnea again and consider trying CPAP again especially if he were to be able to use a nasal interface.  His Past Medical History Is Significant For: Past Medical History:  Diagnosis Date  . Allergy   . Barrett's esophagus   . GERD (gastroesophageal reflux disease)    occ and uses nexium for this prn only  . Hx of adenomatous colonic polyps 01/13/2016  . Hyperlipidemia   . Low bone density 03/20/2016  . Ulcerative colitis ([place])   . Vitamin D deficiency 03/10/2016    His Past Surgical History Is Significant For: Past Surgical History:  Procedure Laterality Date  . CHOLECYSTECTOMY N/A   . COLONOSCOPY    . TONSILLECTOMY    . UPPER GASTROINTESTINAL ENDOSCOPY      His Family History Is Significant For: Family History  Problem Relation Age of Onset  . Colon cancer Mother   . Breast cancer Sister   . Hyperlipidemia Sister   . Colon polyps Neg Hx   . Esophageal cancer Neg Hx   . Stomach cancer Neg Hx   . Rectal cancer Neg Hx     His Social History Is Significant For: Social History   Socioeconomic History  . Marital status: Single  Spouse name: Not on file  . Number of children: Not on file  . Years of education: Not on file  . Highest education level: Not on file  Occupational History  . Occupation: Multimedia programmer: [employer]  . Financial resource strain: Not on file  . Food insecurity:    Worry: Not on file    Inability: Not on file  . Transportation needs:    Medical: Not on file    Non-medical: Not on file  Tobacco Use  . Smoking status: Former Smoker    Packs/day: 0.50    Years: 10.00    Pack years: 5.00    Last attempt to quit: 11/14/1994    Years since  quitting: 23.3  . Smokeless tobacco: Never Used  Substance and Sexual Activity  . Alcohol use: Yes    Alcohol/week: 10.8 oz    Types: 18 Standard drinks or equivalent per week    Comment: up to 18 beers a week - 3-4 a night on nights that he drinks  . Drug use: No  . Sexual activity: Yes    Partners: Female  Lifestyle  . Physical activity:    Days per week: Not on file    Minutes per session: Not on file  . Stress: Not on file  Relationships  . Social connections:    Talks on phone: Not on file    Gets together: Not on file    Attends religious service: Not on file    Active member of club or organization: Not on file    Attends meetings of clubs or organizations: Not on file    Relationship status: Not on file  Other Topics Concern  . Not on file  Social History Narrative   Single - self-employed [name]   4 EtOH/day   1 caffeine/day    His Allergies Are:  No Known Allergies:   His Current Medications Are:  Outpatient Encounter Medications as of 03/22/2018  Medication Sig  . chlorthalidone (HYGROTON) 25 MG tablet Take 0.5 tablets (12.5 mg total) by mouth daily.  Marland Kitchen olmesartan (BENICAR) 20 MG tablet Take 1 tablet (20 mg total) by mouth daily.  Marland Kitchen zolpidem (AMBIEN CR) 12.5 MG CR tablet Take 1 tablet (12.5 mg total) by mouth at bedtime as needed for sleep.   No facility-administered encounter medications on file as of 03/22/2018.   :  Review of Systems:  Out of a complete 14 point review of systems, all are reviewed and negative with the exception of these symptoms as listed below: Review of Systems  Neurological:       Patient reports that he had a sleepy study done many years ago that showed he had sleep apnea.   Epworth Sleepiness Scale 0= would never doze 1= slight chance of dozing 2= moderate chance of dozing 3= high chance of dozing  Sitting and reading:1 Watching TV:2 Sitting inactive in a public place (ex. Theater or meeting):0 As a passenger in a car  for an hour without a break:0 Lying down to rest in the afternoon:2 Sitting and talking to someone:0 Sitting quietly after lunch (no alcohol):0 In a car, while stopped in traffic:0 Total:5     Objective:  Neurological Exam  Physical Exam Physical Examination:   Vitals:   03/22/18 1300  BP: 118/76  Pulse: 93    General Examination: The patient is a very pleasant 62 y.o. male in no acute distress. He appears well-developed and well-nourished and  adequately groomed.   HEENT: Normocephalic, atraumatic, pupils are equal, round and reactive to light and accommodation. Extraocular tracking is good without limitation to gaze excursion or nystagmus noted. Normal smooth pursuit is noted. Hearing is grossly intact. Face is symmetric with normal facial animation and normal facial sensation. Speech is clear with no dysarthria noted. There is no hypophonia. There is no lip, neck/head, jaw or voice tremor. Neck is supple with full range of passive and active motion. There are no carotid bruits on auscultation. Oropharynx exam reveals: mild mouth dryness, adequate dental hygiene and marked airway crowding, due to smaller airway entry and redundant soft palate, large uvula. Mallampati is class III. Tongue protrudes centrally and palate elevates symmetrically. Tonsils are absent. Neck size is 16.5 inches. He has a Moderate overbite.   Chest: Clear to auscultation without wheezing, rhonchi or crackles noted.  Heart: S1+S2+0, regular and normal without murmurs, rubs or gallops noted.   Abdomen: Soft, non-tender and non-distended with normal bowel sounds appreciated on auscultation.  Extremities: There is no pitting edema in the distal lower extremities bilaterally. Pedal pulses are intact.  Skin: Warm and dry without trophic changes noted.  Musculoskeletal: exam reveals no obvious joint deformities, tenderness or joint swelling or erythema.   Neurologically:  Mental status: The patient is awake,  alert and oriented in all 4 spheres. His immediate and remote memory, attention, language skills and fund of knowledge are appropriate. There is no evidence of aphasia, agnosia, apraxia or anomia. Speech is clear with normal prosody and enunciation. Thought process is linear. Mood is normal and affect is normal.  Cranial nerves II - XII are as described above under HEENT exam. In addition: shoulder shrug is normal with equal shoulder height noted. Motor exam: Normal bulk, strength and tone is noted. There is no drift, tremor or rebound. Romberg is negative. Reflexes are 2+ throughout. Fine motor skills and coordination: intact with normal finger taps, normal hand movements, normal rapid alternating patting, normal foot taps and normal foot agility.  Cerebellar testing: No dysmetria or intention tremor on finger to nose testing.   Sensory exam: intact to light touch in the upper and lower extremities.  Gait, station and balance: He stands easily. No veering to one side is noted. No leaning to one side is noted. Posture is age-appropriate and stance is narrow based. Gait shows normal stride length and normal pace. No problems turning are noted.  Tandem walk is challenging for him.    Assessment and Plan:   In summary, Trasean Delima is a very pleasant 62 y.o.-year old male with an underlying medical history of allergies, reflux disease, vitamin D deficiency, ulcerative colitis, hypertension, hyperlipidemia, and obesity, who was previously diagnosed with severe obstructive sleep apnea and tried CPAP therapy several years ago. He would benefit from reevaluation and consideration of treatment. I had a long chat with the patient about my findings and the diagnosis of OSA, its prognosis and treatment options. We talked about medical treatments, surgical interventions and non-pharmacological approaches. I explained in particular the risks and ramifications of untreated moderate to severe OSA, especially with  respect to developing cardiovascular disease down the Road, including congestive heart failure, difficult to treat hypertension, cardiac arrhythmias, or stroke. Even type 2 diabetes has, in part, been linked to untreated OSA. Symptoms of untreated OSA include daytime sleepiness, memory problems, mood irritability and mood disorder such as depression and anxiety, lack of energy, as well as recurrent headaches, especially morning headaches. We talked about trying to  maintain a healthy lifestyle in general, as well as the importance of weight control. I encouraged the patient to eat healthy, exercise daily and keep well hydrated, to keep a scheduled bedtime and wake time routine, to not skip any meals and eat healthy snacks in between meals. I advised the patient not to drive when feeling sleepy. I recommended the following at this time: sleep study with potential positive airway pressure titration. (We will score hypopneas at 3%).   I explained the sleep test procedure to the patient and also outlined possible surgical and non-surgical treatment options of OSA, including the use of a custom-made dental device (which would require a referral to a specialist dentist or oral surgeon), upper airway surgical options, such as pillar implants, radiofrequency surgery, tongue base surgery, and UPPP (which would involve a referral to an ENT surgeon). Rarely, jaw surgery such as mandibular advancement may be considered.  I also explained the CPAP treatment option to the patient, who indicated that he would be willing to try CPAP if the need arises. I explained the importance of being compliant with PAP treatment, not only for insurance purposes but primarily to improve His symptoms, and for the patient's long term health benefit, including to reduce His cardiovascular risks. I answered all his questions today and the patient was in agreement. I would like to see him back after the sleep study is completed and encouraged him  to call with any interim questions, concerns, problems or updates.   Thank you very much for allowing me to participate in the care of this nice patient. If I can be of any further assistance to you please do not hesitate to call me at [phone].  Sincerely,   Star Age, MD, PhD

## 2018-03-22 NOTE — Patient Instructions (Signed)

## 2018-03-30 ENCOUNTER — Ambulatory Visit: Payer: BC Managed Care – PPO | Admitting: Physician Assistant

## 2018-03-30 ENCOUNTER — Other Ambulatory Visit: Payer: Self-pay

## 2018-03-30 ENCOUNTER — Encounter: Payer: Self-pay | Admitting: Physician Assistant

## 2018-03-30 DIAGNOSIS — F5101 Primary insomnia: Secondary | ICD-10-CM

## 2018-03-30 DIAGNOSIS — I10 Essential (primary) hypertension: Secondary | ICD-10-CM

## 2018-03-30 MED ORDER — ZOLPIDEM TARTRATE ER 12.5 MG PO TBCR: 12.5000 mg | EXTENDED_RELEASE_TABLET | Freq: Every evening | ORAL | 2 refills | Status: DC | PRN

## 2018-03-30 MED ORDER — OLMESARTAN MEDOXOMIL 20 MG PO TABS: 20.0000 mg | ORAL_TABLET | Freq: Every day | ORAL | 1 refills | Status: DC

## 2018-03-30 NOTE — Progress Notes (Signed)
Dennis Orr  MRN: [id number] DOB: 1956/09/11  PCP: Mancel Bale, PA-C  Chief Complaint  Patient presents with  . Hypertension    follow up     Subjective:  Pt presents to clinic for medication follow-up.  Since his last visit with me he removed a pillow top or on his mattress and since doing that all of his body aches have completely resolved.  Since starting the Benicar his blood pressure is been much better controlled at home.  He was at some point started on chlorthalidone but he is no longer using that as he did not like how it made him feel even without that his blood pressure is well controlled.  He saw Dr. Brett Fairy and is being set up for sleep study to determine where he is with his sleep apnea at this time.  History is obtained by patient.  Review of Systems  Constitutional: Negative for chills and fever.  Eyes: Negative for visual disturbance.  Respiratory: Negative for cough and shortness of breath.   Cardiovascular: Negative for chest pain, palpitations and leg swelling.  Neurological: Negative for dizziness, light-headedness and headaches.    Patient Active Problem List   Diagnosis Date Noted  . Insomnia 06/21/2017  . Elevated cholesterol 05/15/2017  . Elevated BP without diagnosis of hypertension 05/15/2017  . History of sleep apnea 05/15/2017  . Low bone density 03/20/2016  . Vitamin D deficiency 03/10/2016  . Hx of adenomatous colonic polyps 01/13/2016  . Barrett's esophagus - NOT - egd 12/2015 no changes 12/09/2015  . Ulcerative colitis ([place]) 07/29/2015    No current outpatient medications on file prior to visit.   No current facility-administered medications on file prior to visit.     No Known Allergies  Past Medical History:  Diagnosis Date  . Allergy   . Barrett's esophagus   . GERD (gastroesophageal reflux disease)    occ and uses nexium for this prn only  . Hx of adenomatous colonic polyps 01/13/2016  . Hyperlipidemia   . Low bone density  03/20/2016  . Ulcerative colitis ([place])   . 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 Smoking status: Former Smoker    Packs/day: 0.50    Years: 10.00    Pack years: 5.00    Last attempt to quit: 11/14/1994    Years since quitting: 23.3  . Smokeless tobacco: Never Used  Substance Use Topics  . Alcohol use: Yes    Alcohol/week: 10.8 oz    Types: 18 Standard drinks or equivalent per week    Comment: up to 18 beers a week - 3-4 a night on nights that he drinks  . Drug use: No   family history includes Breast cancer in his sister; Colon cancer in his mother; Hyperlipidemia in his sister.     Objective:  BP 124/66   Pulse 81   Temp 99.7 F (37.6 C) (Oral)   Resp 18   Ht 5' 7"  (1.702 m)   Wt 187 lb 12.8 oz (85.2 kg)   SpO2 98%   BMI 29.41 kg/m  Body mass index is 29.41 kg/m.  Physical Exam  Constitutional: He is oriented to person, place, and time.  HENT:  Head: Normocephalic and atraumatic.  Right Ear: External ear normal.  Left Ear: External ear normal.  Eyes: Conjunctivae  are normal.  Neck: Normal range of motion.  Cardiovascular: Normal rate, regular rhythm, normal heart sounds and intact distal pulses.  Pulmonary/Chest: Effort normal and breath sounds normal. He has no wheezes.  Musculoskeletal:       Right lower leg: He exhibits no edema.       Left lower leg: He exhibits no edema.  Neurological: He is alert and oriented to person, place, and time.  Skin: Skin is warm and dry.  Psychiatric: Judgment normal.    Assessment and Plan :  Essential hypertension - Plan: olmesartan (BENICAR) 20 MG tablet -blood pressure is well controlled on current medication he will continue this for the next 6 months.  Primary insomnia - Plan: zolpidem (AMBIEN CR) 12.5 MG CR tablet -Ambien helps his sleep son he has been scheduled for sleep  study.  Windell Hummingbird PA-C  [facility] 03/30/2018 4:31 PM

## 2018-03-30 NOTE — Patient Instructions (Signed)
     IF you received an x-ray today, you will receive an invoice from [facility]. Please contact [facility] at [phone] with questions or concerns regarding your invoice.   IF you received labwork today, you will receive an invoice from [facility]. Please contact [facility] at [phone] with questions or concerns regarding your invoice.   Our billing staff will not be able to assist you with questions regarding bills from these companies.  You will be contacted with the lab results as soon as they are available. The fastest way to get your results is to activate your My Chart account. Instructions are located on the last page of this paperwork. If you have not heard from us regarding the results in 2 weeks, please contact this office.     

## 2018-05-14 ENCOUNTER — Ambulatory Visit (INDEPENDENT_AMBULATORY_CARE_PROVIDER_SITE_OTHER): Payer: BC Managed Care – PPO | Admitting: Neurology

## 2018-05-14 DIAGNOSIS — G4733 Obstructive sleep apnea (adult) (pediatric): Secondary | ICD-10-CM

## 2018-05-14 DIAGNOSIS — G4720 Circadian rhythm sleep disorder, unspecified type: Secondary | ICD-10-CM

## 2018-05-14 DIAGNOSIS — E663 Overweight: Secondary | ICD-10-CM

## 2018-05-14 DIAGNOSIS — G479 Sleep disorder, unspecified: Secondary | ICD-10-CM

## 2018-05-14 DIAGNOSIS — R351 Nocturia: Secondary | ICD-10-CM

## 2018-05-22 ENCOUNTER — Telehealth: Payer: Self-pay

## 2018-05-22 NOTE — Progress Notes (Signed)
Patient referred by Windell Hummingbird, PA, seen by me on 03/22/18, diagnostic PSG on 05/14/18.   Please call and notify the patient that the recent sleep study showed severe obstructive sleep apnea, with a total AHI of 65.6/hour, and O2 nadir of 83%. Of note, the absence of REM sleep likely underestimates his AHI and O2 nadir. I recommend treatment for this in the form of CPAP. This will require a repeat sleep study for proper titration and mask fitting and correct monitoring of the oxygen saturations. Please explain to patient. I have placed an order in the chart. Thanks.  Star Age, MD, PhD [facility])

## 2018-05-22 NOTE — Addendum Note (Signed)
Addended by: Star Age on: 05/22/2018 09:53 AM   Modules accepted: Orders

## 2018-05-22 NOTE — Procedures (Signed)
PATIENT'S NAME:  Dennis Orr, Dennis Orr DOB:      1955/12/23      MR#:    [id number]     DATE OF RECORDING: 05/14/2018 REFERRING M.D.:  Windell Hummingbird, PA Study Performed:   Baseline Polysomnogram HISTORY: 62 year old man with a history of allergies, reflux disease, vitamin D deficiency, ulcerative colitis, hypertension, hyperlipidemia, and obesity, who was previously diagnosed with obstructive sleep apnea and placed on CPAP therapy. He no longer uses a CPAP. He had difficulty tolerating the full facemask as well as the pressure. The patient endorsed the Epworth Sleepiness Scale at 5/24 points. The patient's weight 189 pounds with a height of 67 (inches), resulting in a BMI of 29.8 kg/m2. The patient's neck circumference measured 16.5 inches.  CURRENT MEDICATIONS: [place], Benicar, Ambien   PROCEDURE:  This is a multichannel digital polysomnogram utilizing the Somnostar 11.2 system.  Electrodes and sensors were applied and monitored per AASM Specifications.   EEG, EOG, Chin and Limb EMG, were sampled at 200 Hz.  ECG, Snore and Nasal Pressure, Thermal Airflow, Respiratory Effort, CPAP Flow and Pressure, Oximetry was sampled at 50 Hz. Digital video and audio were recorded.      BASELINE STUDY  Lights Out was at 22:12 and Lights On at 04:59.  Total recording time (TRT) was 408 minutes, with a total sleep time (TST) of  260.5 minutes.   The patient's sleep latency was 127 minutes, which is markedly delayed. REM latency was absent. The sleep efficiency was 63.8%, which is reduced.     SLEEP ARCHITECTURE: WASO (Wake after sleep onset) was 16.5 minutes.  There were 1.5 minutes in Stage N1, 259 minutes Stage N2, 0 minutes Stage N3 and 0 minutes in Stage REM.  The percentage of Stage N1 was .6%, Stage N2 was 99.4%, which is markedly increased, Stage N3 and Stage R (REM sleep) were absent. The arousals were noted as: 57 were spontaneous, 0 were associated with PLMs, 190 were associated with respiratory  events.  RESPIRATORY ANALYSIS:  There were a total of 285 respiratory events:  223 obstructive apneas, 0 central apneas and 1 mixed apneas with a total of 224 apneas and an apnea index (AI) of 51.6 /hour. There were 61 hypopneas with a hypopnea index of 14. /hour. The patient also had 0 respiratory event related arousals (RERAs).      The total APNEA/HYPOPNEA INDEX (AHI) was 65.6/hour and the total RESPIRATORY DISTURBANCE INDEX was 0. 65.6 /hour.  0 events occurred in REM sleep and 123 events in NREM. The REM AHI was 0. 0 /hour, versus a non-REM AHI of 65.6. The patient spent 211 minutes of total sleep time in the supine position and 50 minutes in non-supine.. The supine AHI was 71.1 versus a non-supine AHI of 42.4.  OXYGEN SATURATION & C02:  The Wake baseline 02 saturation was 94%, with the lowest being 83%. Time spent below 89% saturation equaled 78 minutes.  PERIODIC LIMB MOVEMENTS: The patient had a total of 0 Periodic Limb Movements.  The Periodic Limb Movement (PLM) index was 0 and the PLM Arousal index was 0/hour.  Audio and video analysis did not show any abnormal or unusual movements, behaviors, phonations or vocalizations. The patient took no bathroom breaks. Mild to at times moderate snoring was noted. The EKG was in keeping with normal sinus rhythm (NSR).   Post-study, the patient indicated that sleep was the same as usual.   IMPRESSION:  1. Obstructive Sleep Apnea (OSA) 2. Dysfunctions associated with sleep stages or  arousals from sleep   RECOMMENDATIONS:  1. This study demonstrates severe obstructive sleep apnea, with a total AHI of 65.6/hour, and O2 nadir of 83%. Of note, the absence of REM sleep likely underestimates his AHI and O2 nadir. Treatment with positive airway pressure in the form of CPAP is recommended. This will require a full night titration study to optimize therapy. Other treatment options may include avoidance of supine sleep position along with weight loss, upper  airway or jaw surgery in selected patients or the use of an oral appliance in certain patients. ENT evaluation and/or consultation with a maxillofacial surgeon or dentist may be feasible in some instances.    2. Please note that untreated obstructive sleep apnea may carry additional perioperative morbidity. Patients with significant obstructive sleep apnea should receive perioperative PAP therapy and the surgeons and particularly the anesthesiologist should be informed of the diagnosis and the severity of the sleep disordered breathing. 3. This study shows abnormal sleep stage percentages; these are nonspecific findings and per se do not signify an intrinsic sleep disorder or a cause for the patient's sleep-related symptoms. Causes include (but are not limited to) the first night effect of the sleep study, circadian rhythm disturbances, medication effect or an underlying mood disorder or medical problem.  4. The patient should be cautioned not to drive, work at heights, or operate dangerous or heavy equipment when tired or sleepy. Review and reiteration of good sleep hygiene measures should be pursued with any patient. 5. The patient will be seen in follow-up in the sleep clinic at [facility] for discussion of the test results, symptom and treatment compliance review, further management strategies, etc. The referring provider will be notified of the test results.  I certify that I have reviewed the entire raw data recording prior to the issuance of this report in accordance with the Standards of Accreditation of the American Academy of Sleep Medicine (AASM)    Star Age, MD, PhD Diplomat, American Board of Neurology and Sleep Medicine (Neurology and Sleep Medicine)

## 2018-05-22 NOTE — Telephone Encounter (Signed)
I called pt to discuss his sleep study results. No answer, left a message asking him to call me back. 

## 2018-05-22 NOTE — Telephone Encounter (Signed)
-----   Message from Star Age, MD sent at 05/22/2018  9:53 AM EDT ----- Patient referred by Windell Hummingbird, PA, seen by me on 03/22/18, diagnostic PSG on 05/14/18.   Please call and notify the patient that the recent sleep study showed severe obstructive sleep apnea, with a total AHI of 65.6/hour, and O2 nadir of 83%. Of note, the absence of REM sleep likely underestimates his AHI and O2 nadir. I recommend treatment for this in the form of CPAP. This will require a repeat sleep study for proper titration and mask fitting and correct monitoring of the oxygen saturations. Please explain to patient. I have placed an order in the chart. Thanks.  Star Age, MD, PhD [facility])

## 2018-05-23 NOTE — Telephone Encounter (Signed)
I called pt again to discuss. No answer, left a message asking him to call me back.

## 2018-05-23 NOTE — Telephone Encounter (Signed)
I called pt to discuss his sleep study results. No answer, left a message asking him to call me back. 

## 2018-05-24 ENCOUNTER — Encounter: Payer: Self-pay | Admitting: Neurology

## 2018-05-28 NOTE — Telephone Encounter (Signed)
Pt returned my call.I advised pt that Dr. Rexene Alberts reviewed their sleep study results and found that pt has severe osa with an AHI of 65.6/hr and an O2 nadir of 83%, without REM sleep, which may actually underestimate his AHI,  and recommends that pt be treated with a cpap. Dr. Rexene Alberts recommends that pt return for a repeat sleep study in order to properly titrate the cpap and ensure a good mask fit. Pt is agreeable to returning for a titration study. I advised pt that our sleep lab will file with pt's insurance and call pt to schedule the sleep study when we hear back from the pt's insurance regarding coverage of this sleep study. Pt verbalized understanding of results. Pt had no questions at this time but was encouraged to call back if questions arise.

## 2018-06-01 ENCOUNTER — Ambulatory Visit (INDEPENDENT_AMBULATORY_CARE_PROVIDER_SITE_OTHER): Payer: BC Managed Care – PPO | Admitting: Neurology

## 2018-06-01 DIAGNOSIS — G4720 Circadian rhythm sleep disorder, unspecified type: Secondary | ICD-10-CM

## 2018-06-01 DIAGNOSIS — G479 Sleep disorder, unspecified: Secondary | ICD-10-CM

## 2018-06-01 DIAGNOSIS — E663 Overweight: Secondary | ICD-10-CM

## 2018-06-01 DIAGNOSIS — G4733 Obstructive sleep apnea (adult) (pediatric): Secondary | ICD-10-CM | POA: Diagnosis not present

## 2018-06-01 DIAGNOSIS — R351 Nocturia: Secondary | ICD-10-CM

## 2018-06-07 ENCOUNTER — Telehealth: Payer: Self-pay

## 2018-06-07 NOTE — Procedures (Signed)
S PATIENT'S NAME:  Dennis Orr, Dennis Orr DOB:      07-08-1956      MR#:    [id number]     DATE OF RECORDING: 06/01/2018 REFERRING M.D.:  Windell Hummingbird, PA Study Performed:   CPAP  Titration HISTORY: 62 year old man with a history of allergies, reflux disease, vitamin D deficiency, ulcerative colitis, hypertension, hyperlipidemia, and obesity, who presents for a CPAP titration study. His diagnostic PSG on 05/14/18 showed a total AHI of 65.6/hour, and O2 nadir of 83%. The patient endorsed the Epworth Sleepiness Scale at 5 points and the Fatigue Score at 32 points. The patient's weight 190 pounds with a height of 67 (inches), resulting in a BMI of 29.8 kg/m2. The patient's neck circumference measured 16.5 inches.  CURRENT MEDICATIONS: [place], Benicar, Ambien  PROCEDURE:  This is a multichannel digital polysomnogram utilizing the SomnoStar 11.2 system.  Electrodes and sensors were applied and monitored per AASM Specifications.   EEG, EOG, Chin and Limb EMG, were sampled at 200 Hz.  ECG, Snore and Nasal Pressure, Thermal Airflow, Respiratory Effort, CPAP Flow and Pressure, Oximetry was sampled at 50 Hz. Digital video and audio were recorded.      The patient was fitted with a medium Eson 2 nasal mask. CPAP was initiated at 5 cmH20 with heated humidity per AASM standards and pressure was advanced to 8 cmH20 because of hypopneas, apneas and desaturations.  At a PAP pressure of 8 cmH20, there was a reduction of the AHI to 0/hour, with supine NREM sleep achieved and O2 nadir of 94%.   Lights Out was at 22:01 and Lights On at 05:00. Total recording time (TRT) was 419.5 minutes, with a total sleep time (TST) of 257.5 minutes. The patient's sleep latency to persistent sleep was 65 minutes. REM latency was 333 minutes, which is markedly delayed. The sleep efficiency was 61.4 %, which is reduced.    SLEEP ARCHITECTURE: WASO (Wake after sleep onset) was 144.5 minutes with one long period of wakefulness and otherwise mild sleep  fragmentation noted. There were 21.5 minutes in Stage N1, 193.5 minutes Stage N2, 32 minutes Stage N3 and 10.5 minutes in Stage REM.  The percentage of Stage N1 was 8.3%, Stage N2 was 75.1%, which is increased, Stage N3 was 12.4% and Stage R (REM sleep) was 4.1%, which is markedly reduced. The arousals were noted as: 87 were spontaneous, 0 were associated with PLMs, 4 were associated with respiratory events.  RESPIRATORY ANALYSIS:  There was a total of 6 respiratory events: 0 obstructive apneas, 0 central apneas and 1 mixed apneas with a total of 1 apneas and an apnea index (AI) of .2 /hour. There were 5 hypopneas with a hypopnea index of 1.2/hour. The patient also had 0 respiratory event related arousals (RERAs).      The total APNEA/HYPOPNEA INDEX  (AHI) was 1.40/hour and the total RESPIRATORY DISTURBANCE INDEX was 1.4 0./hour  1 events occurred in REM sleep and 5 events in NREM. The REM AHI was 5.7 /hour versus a non-REM AHI of 1.2 0./hour.  The patient spent 257.5 minutes of total sleep time in the supine position and 0 minutes in non-supine. The supine AHI was 1.4, versus a non-supine AHI of 0.0.  OXYGEN SATURATION & C02:  The baseline 02 saturation was 96%, with the lowest being 92%. Time spent below 89% saturation equaled 0 minutes.  PERIODIC LIMB MOVEMENTS:  The patient had a total of 5 Periodic Limb Movements. The Periodic Limb Movement (PLM) index was 1.2 and  the PLM Arousal index was 0 /hour.  Audio and video analysis did not show any abnormal or unusual movements, behaviors, phonations or vocalizations. The patient took no bathroom breaks. The EKG was in keeping with normal sinus rhythm (NSR).  Post-study, the patient indicated that sleep was the same as usual.   IMPRESSION:   1. Obstructive Sleep Apnea (OSA) 2. Dysfunctions associated with sleep stages or arousals from sleep    RECOMMENDATIONS:   1. This study demonstrates resolution of the patient's obstructive sleep apnea with  CPAP therapy. I will, therefore, start the patient on home CPAP treatment at a pressure of 8 cm via medium nasal mask with heated humidity. The patient should be reminded to be fully compliant with PAP therapy to improve sleep related symptoms and decrease long term cardiovascular risks. The patient should be reminded, that it may take up to 3 months to get fully used to using PAP with all planned sleep. The earlier full compliance is achieved, the better long term compliance tends to be. Please note that untreated obstructive sleep apnea may carry additional perioperative morbidity. Patients with significant obstructive sleep apnea should receive perioperative PAP therapy and the surgeons and particularly the anesthesiologist should be informed of the diagnosis and the severity of the sleep disordered breathing. 2. This study shows abnormal sleep stage percentages; these are nonspecific findings and per se do not signify an intrinsic sleep disorder or a cause for the patient's sleep-related symptoms. Causes include (but are not limited to) the first night effect of the sleep study, circadian rhythm disturbances, medication effect or an underlying mood disorder or medical problem.  3. The patient should be cautioned not to drive, work at heights, or operate dangerous or heavy equipment when tired or sleepy. Review and reiteration of good sleep hygiene measures should be pursued with any patient. 4. The patient will be seen in follow-up in the sleep clinic at [facility] for discussion of the test results, symptom and treatment compliance review, further management strategies, etc. The referring provider will be notified of the test results.   I certify that I have reviewed the entire raw data recording prior to the issuance of this report in accordance with the Standards of Accreditation of the American Academy of Sleep Medicine (AASM)       Star Age, MD, PhD Diplomat, American Board of Neurology and Sleep  Medicine (Neurology and Sleep Medicine)

## 2018-06-07 NOTE — Telephone Encounter (Signed)
I called pt. No answer, left a message asking him to call me back.

## 2018-06-07 NOTE — Telephone Encounter (Signed)
-----   Message from Star Age, MD sent at 06/07/2018  8:01 AM EDT ----- Patient referred by Windell Hummingbird, PA, seen by me on 03/22/18, diagnostic PSG on 05/14/18. Patient had a CPAP titration study on 06/01/18.  Please call and inform patient that I have entered an order for treatment with positive airway pressure (PAP) treatment for obstructive sleep apnea (OSA). He did well during the latest sleep study with CPAP. We will, therefore, arrange for a machine for home use through a DME (durable medical equipment) company of His choice; and I will see the patient back in follow-up in about 10 weeks. Please also explain to the patient that I will be looking out for compliance data, which can be downloaded from the machine (stored on an SD card, that is inserted in the machine) or via remote access through a modem, that is built into the machine. At the time of the followup appointment we will discuss sleep study results and how it is going with PAP treatment at home. Please advise patient to bring His machine at the time of the first FU visit, even though this is cumbersome. Bringing the machine for every visit after that will likely not be needed, but often helps for the first visit to troubleshoot if needed. Please re-enforce the importance of compliance with treatment and the need for [place] to monitor compliance data - often an insurance requirement and actually good feedback for the patient as far as how they are doing.  Also remind patient, that any interim PAP machine or mask issues should be first addressed with the DME company, as they can often help better with technical and mask fit issues. Please ask if patient has a preference regarding DME company.  Please also make sure, the patient has a follow-up appointment with me in about 10 weeks from the setup date, thanks. May see one of our nurse practitioners if needed for proper timing of the FU appointment.  Please fax or rout report to the referring provider.  Thanks,   Star Age, MD, PhD [facility])

## 2018-06-07 NOTE — Telephone Encounter (Signed)
I called pt to discuss his sleep study results. No answer, left a message asking him to call me back. 

## 2018-06-07 NOTE — Addendum Note (Signed)
Addended by: Star Age on: 06/07/2018 08:01 AM   Modules accepted: Orders

## 2018-06-07 NOTE — Progress Notes (Signed)
Patient referred by Windell Hummingbird, PA, seen by me on 03/22/18, diagnostic PSG on 05/14/18. Patient had a CPAP titration study on 06/01/18.  Please call and inform patient that I have entered an order for treatment with positive airway pressure (PAP) treatment for obstructive sleep apnea (OSA). He did well during the latest sleep study with CPAP. We will, therefore, arrange for a machine for home use through a DME (durable medical equipment) company of His choice; and I will see the patient back in follow-up in about 10 weeks. Please also explain to the patient that I will be looking out for compliance data, which can be downloaded from the machine (stored on an SD card, that is inserted in the machine) or via remote access through a modem, that is built into the machine. At the time of the followup appointment we will discuss sleep study results and how it is going with PAP treatment at home. Please advise patient to bring His machine at the time of the first FU visit, even though this is cumbersome. Bringing the machine for every visit after that will likely not be needed, but often helps for the first visit to troubleshoot if needed. Please re-enforce the importance of compliance with treatment and the need for [place] to monitor compliance data - often an insurance requirement and actually good feedback for the patient as far as how they are doing.  Also remind patient, that any interim PAP machine or mask issues should be first addressed with the DME company, as they can often help better with technical and mask fit issues. Please ask if patient has a preference regarding DME company.  Please also make sure, the patient has a follow-up appointment with me in about 10 weeks from the setup date, thanks. May see one of our nurse practitioners if needed for proper timing of the FU appointment.  Please fax or rout report to the referring provider. Thanks,   Star Age, MD, PhD [facility])

## 2018-06-07 NOTE — Telephone Encounter (Signed)
I called pt. I advised pt that Dr. Rexene Alberts reviewed their sleep study results and found that pt did well with the cpap during his latest sleep study. Dr. Rexene Alberts recommends that pt start a cpap at [facility]. Pt reports that he has a functional cpap at  I reviewed PAP compliance expectations with the pt. Pt is agreeable to starting a CPAP. I advised pt that an order will be sent to a DME, [facility], and [employer] will call the pt within about one week after they file with the pt's insurance. [facility] will show the pt how to use the machine, fit for masks, and troubleshoot the CPAP if needed. [facility] will check pt's cpap from home to see if it is functional and if it is able to be used. A follow up appt was made for insurance purposes with Dr. Rexene Alberts on 09/11/18 at 10:30am. Pt verbalized understanding to arrive 15 minutes early and bring their CPAP. A letter with all of this information in it will be mailed to the pt as a reminder. I verified with the pt that the address we have on file is correct. Pt verbalized understanding of results. Pt had no questions at this time but was encouraged to call back if questions arise.

## 2018-06-07 NOTE — Telephone Encounter (Signed)
Pt returning RN's call.

## 2018-07-06 ENCOUNTER — Telehealth: Payer: Self-pay | Admitting: Physician Assistant

## 2018-07-06 NOTE — Telephone Encounter (Signed)
Left a voicemail in regards to his appt with Windell Hummingbird on 10/05/2018. The provider is leaving our office and needs to be rescheduled/cancelled.

## 2018-08-20 ENCOUNTER — Encounter: Payer: Self-pay | Admitting: Physician Assistant

## 2018-08-20 ENCOUNTER — Other Ambulatory Visit: Payer: Self-pay

## 2018-08-20 ENCOUNTER — Ambulatory Visit (INDEPENDENT_AMBULATORY_CARE_PROVIDER_SITE_OTHER): Payer: BC Managed Care – PPO | Admitting: Physician Assistant

## 2018-08-20 VITALS — BP 142/86 | HR 64 | Temp 97.8°F | Resp 20 | Ht 66.93 in | Wt 185.4 lb

## 2018-08-20 DIAGNOSIS — E7800 Pure hypercholesterolemia, unspecified: Secondary | ICD-10-CM | POA: Diagnosis not present

## 2018-08-20 DIAGNOSIS — G4700 Insomnia, unspecified: Secondary | ICD-10-CM

## 2018-08-20 DIAGNOSIS — R7989 Other specified abnormal findings of blood chemistry: Secondary | ICD-10-CM | POA: Diagnosis not present

## 2018-08-20 DIAGNOSIS — I10 Essential (primary) hypertension: Secondary | ICD-10-CM

## 2018-08-20 DIAGNOSIS — Z23 Encounter for immunization: Secondary | ICD-10-CM

## 2018-08-20 MED ORDER — ZOLPIDEM TARTRATE 10 MG PO TABS: ORAL_TABLET | ORAL | 0 refills | Status: DC

## 2018-08-20 NOTE — Patient Instructions (Addendum)
I suggest we taper you off ambien and try other options for sleep assistance. To taper off ambien, start ambien 1m every night for the next week, then after one week, decrease to half a tablet each night. Follow up with me in 4 weeks for reevaluation. We will discuss your lab work at that visit. In the meantime, continue wearing your CPAP machine and working on sleep hygiene.    If you have lab work done today you will be contacted with your lab results within the next 2 weeks.  If you have not heard from [place] then please contact [place] The fastest way to get your results is to register for My Chart.   Insomnia is a common disorder, and effective treatment can be crucial to getting the sleep you need. Explore safe, effective, nondrug insomnia treatments. By [facility]  Insomnia is a common sleep disorder that can make it hard to fall asleep, hard to stay asleep, or cause you to wake up too early and not be able to get back to sleep. Cognitive behavioral therapy for insomnia, sometimes called CBT-I, is an effective treatment for chronic sleep problems and is usually recommended as the first line of treatment.  Cognitive behavioral therapy for insomnia is a structured program that helps you identify and replace thoughts and behaviors that cause or worsen sleep problems with habits that promote sound sleep. Unlike sleeping pills, CBT-I helps you overcome the underlying causes of your sleep problems. To identify how to best treat your insomnia, your sleep therapist may have you keep a detailed sleep diary for one to two weeks. How does cognitive behavioral therapy for insomnia work?  The cognitive part of CBT-I teaches you to recognize and change beliefs that affect your ability to sleep. This type of therapy can help you control or eliminate negative thoughts and worries that keep you awake. The behavioral part of CBT-I helps you develop good sleep habits and avoid behaviors that keep you from  sleeping well.  Depending on your needs, your sleep therapist may recommend some of these CBT-I techniques: Stimulus control therapy. This method helps remove factors that condition your mind to resist sleep. For example, you might be coached to set a consistent bedtime and wake time and avoid naps, use the bed only for sleep and sex, and leave the bedroom if you can't go to sleep within 20 minutes, only returning when you're sleepy. Sleep restriction. Lying in bed when you're awake can become a habit that leads to poor sleep. This treatment reduces the time you spend in bed, causing partial sleep deprivation, which makes you more tired the next night. Once your sleep has improved, your time in bed is gradually increased. Sleep hygiene. This method of therapy involves changing basic lifestyle habits that influence sleep, such as smoking or drinking too much caffeine late in the day, drinking too much alcohol, or not getting regular exercise. It also includes tips that help you sleep better, such as ways to wind down an hour or two before bedtime. Sleep environment improvement. This offers ways that you can create a comfortable sleep environment, such as keeping your bedroom quiet, dark and cool, not having a TV in the bedroom, and hiding the clock from view. Relaxation training. This method helps you calm your mind and body. Approaches include meditation, imagery, muscle relaxation and others. Remaining passively awake. Also called paradoxical intention, this involves avoiding any effort to fall asleep. Paradoxically, worrying that you can't sleep can  actually keep you awake. Letting go of this worry can help you relax and make it easier to fall asleep. Biofeedback. This method allows you to observe biological signs such as heart rate and muscle tension and shows you how to adjust them. Your sleep specialist may have you take a biofeedback device home to record your daily patterns. This information can help  identify patterns that affect sleep. The most effective treatment approach may combine several of these methods. Cognitive behavioral therapy vs. pills  Sleep medications can be an effective short-term treatment - for example, they can provide immediate relief during a period of high stress or grief. Some newer sleeping medications have been approved for longer use. But they may not be the best long-term insomnia treatment.  Cognitive behavioral therapy for insomnia may be a good treatment choice if you have long-term sleep problems, you're worried about becoming dependent on sleep medications, or if medications aren't effective or cause bothersome side effects. Unlike pills, CBT-I addresses the underlying causes of insomnia rather than just relieving symptoms. But it takes time - and effort - to make it work. In some cases, a combination of sleep medication and CBT-I may be the best approach. Insomnia and other disorders  Insomnia is linked to a number of physical and mental health disorders. Ongoing lack of sleep increases your risk of health conditions such as high blood pressure, heart disease, diabetes and chronic pain. Some medications, including over-the-counter medications, also can contribute to insomnia.  If you have a condition or medication that's linked to insomnia, talk to your doctor about how best to manage these along with sleep problems. Insomnia is unlikely to get better without treatment. Finding help  There are a limited number of certified Behavioral Sleep Medicine specialists, and you may not live near a practitioner. You may have to do some searching to find a trained practitioner and a treatment schedule and type that fit your needs. Here are some places to look: The American Academy of Sleep Medicine website allows you to search for a certified sleep center, such as [facility]. The Society of Behavioral Sleep Medicine website offers a directory  for finding a behavioral sleep medicine provider.  The type of treatment and frequency of sessions can vary. You may need as few as two sessions or as many as eight or more sessions, depending on your sleep expert, the program and your progress. When calling to set up an appointment, ask the practitioner about his or her approach and what to expect. Also check ahead of time whether your health insurance will cover the type of treatment you need. If available in your area, meet with a sleep medicine specialist in person for your sessions. However, phone consultation, CDs, books or websites on CBT techniques and insomnia also may be beneficial. Who can benefit from cognitive behavioral therapy for insomnia?  Cognitive behavioral therapy for insomnia can benefit nearly anyone with sleep problems. CBT-I can help people who have primary insomnia as well as people with physical problems, such as chronic pain, or mental health disorders, such as depression and anxiety. What's more, the effects seem to last. And there is no evidence that CBT-I has negative side effects.  CBT-I requires steady practice, and some approaches may cause you to lose sleep at first. But stick with it, and you'll likely see lasting results.  Insomnia Insomnia is a sleep disorder that makes it difficult to fall asleep or to stay asleep. Insomnia can  cause tiredness (fatigue), low energy, difficulty concentrating, mood swings, and poor performance at work or school. There are three different ways to classify insomnia:  Difficulty falling asleep.  Difficulty staying asleep.  Waking up too early in the morning.  Any type of insomnia can be long-term (chronic) or short-term (acute). Both are common. Short-term insomnia usually lasts for three months or less. Chronic insomnia occurs at least three times a week for longer than three months. What are the causes? Insomnia may be caused by another condition, situation, or substance,  such as:  Anxiety.  Certain medicines.  Gastroesophageal reflux disease (GERD) or other gastrointestinal conditions.  Asthma or other breathing conditions.  Restless legs syndrome, sleep apnea, or other sleep disorders.  Chronic pain.  Menopause. This may include hot flashes.  Stroke.  Abuse of alcohol, tobacco, or illegal drugs.  Depression.  Caffeine.  Neurological disorders, such as Alzheimer disease.  An overactive thyroid (hyperthyroidism).  The cause of insomnia may not be known. What increases the risk? Risk factors for insomnia include:  Gender. Women are more commonly affected than men.  Age. Insomnia is more common as you get older.  Stress. This may involve your professional or personal life.  Income. Insomnia is more common in people with lower income.  Lack of exercise.  Irregular work schedule or night shifts.  Traveling between different time zones.  What are the signs or symptoms? If you have insomnia, trouble falling asleep or trouble staying asleep is the main symptom. This may lead to other symptoms, such as:  Feeling fatigued.  Feeling nervous about going to sleep.  Not feeling rested in the morning.  Having trouble concentrating.  Feeling irritable, anxious, or depressed.  How is this treated? Treatment for insomnia depends on the cause. If your insomnia is caused by an underlying condition, treatment will focus on addressing the condition. Treatment may also include:  Medicines to help you sleep.  Counseling or therapy.  Lifestyle adjustments.  Follow these instructions at home:  Take medicines only as directed by your health care provider.  Keep regular sleeping and waking hours. Avoid naps.  Keep a sleep diary to help you and your health care provider figure out what could be causing your insomnia. Include: ? When you sleep. ? When you wake up during the night. ? How well you sleep. ? How rested you feel the next  day. ? Any side effects of medicines you are taking. ? What you eat and drink.  Make your bedroom a comfortable place where it is easy to fall asleep: ? Put up shades or special blackout curtains to block light from outside. ? Use a white noise machine to block noise. ? Keep the temperature cool.  Exercise regularly as directed by your health care provider. Avoid exercising right before bedtime.  Use relaxation techniques to manage stress. Ask your health care provider to suggest some techniques that may work well for you. These may include: ? Breathing exercises. ? Routines to release muscle tension. ? Visualizing peaceful scenes.  Cut back on alcohol, caffeinated beverages, and cigarettes, especially close to bedtime. These can disrupt your sleep.  Do not overeat or eat spicy foods right before bedtime. This can lead to digestive discomfort that can make it hard for you to sleep.  Limit screen use before bedtime. This includes: ? Watching TV. ? Using your smartphone, tablet, and computer.  Stick to a routine. This can help you fall asleep faster. Try to do a quiet  activity, brush your teeth, and go to bed at the same time each night.  Get out of bed if you are still awake after 15 minutes of trying to sleep. Keep the lights down, but try reading or doing a quiet activity. When you feel sleepy, go back to bed.  Make sure that you drive carefully. Avoid driving if you feel very sleepy.  Keep all follow-up appointments as directed by your health care provider. This is important. Contact a health care provider if:  You are tired throughout the day or have trouble in your daily routine due to sleepiness.  You continue to have sleep problems or your sleep problems get worse. Get help right away if:  You have serious thoughts about hurting yourself or someone else. This information is not intended to replace advice given to you by your health care provider. Make sure you discuss any  questions you have with your health care provider. Document Released: 10/28/2000 Document Revised: 04/01/2016 Document Reviewed: 08/01/2014 Elsevier Interactive Patient Education  2018 [employer].    IF you received an x-ray today, you will receive an invoice from [facility]. Please contact [facility] at [phone] with questions or concerns regarding your invoice.   IF you received labwork today, you will receive an invoice from [facility]. Please contact [facility] at [phone] with questions or concerns regarding your invoice.   Our billing staff will not be able to assist you with questions regarding bills from these companies.  You will be contacted with the lab results as soon as they are available. The fastest way to get your results is to activate your My Chart account. Instructions are located on the last page of this paperwork. If you have not heard from [place] regarding the results in 2 weeks, please contact this office.

## 2018-08-20 NOTE — Progress Notes (Signed)
MRN: [id number] DOB: [birth year]  Subjective:   Dennis Orr is a 62 y.o. male presenting for follow up on Hypertension and insomnia.    HTN: Currently managed with benicar 33m daily. Patient is checking blood pressure at home, range is 1094Msystolic. Denies lightheadedness, dizziness, chronic headache, double vision, chest pain, shortness of breath, heart racing, palpitations, nausea, vomiting, abdominal pain, hematuria, lower leg swelling. Lifestyle: Eats a variety of foods. Likes meat. Tries to avoid salty foods. Walks at least 4-6 miles a day. Former smoker, quit in 1996.   HLD: Diet controlled. Has tried at least 5 statins in the past and they made him feel "terrible."   Insomnia: Typically goes to bed around 9pm and wakes up 3am. Feels rested when he wakes up but does not want to wake up until. No caffeine after noon. Does watch TV while in bed. Finally got on CPAP in August for OSA , believes this is helping with his sleep. Not so sure if aLorrin Maisis working. Has taken it nightly for the past 2+ years.  Not sure if this made a difference.  Thinks he has tried other sleep medicine in the past but is not sure.  Denies illicit drug use.  Review of Systems  Cardiovascular: Negative for palpitations.  Gastrointestinal: Negative for constipation and diarrhea.  Neurological: Negative for dizziness.  Endo/Heme/Allergies:       Negative for heat and cold intolerance    STielerhas a current medication list which includes the following prescription(s): olmesartan and zolpidem. Also has No Known Allergies.  SIsaak has a past medical history of Allergy, Barrett's esophagus, GERD (gastroesophageal reflux disease), adenomatous colonic polyps (01/13/2016), Hyperlipidemia, Low bone density (03/20/2016), Ulcerative colitis ([place], and Vitamin D deficiency (03/10/2016). Also  has a past surgical history that includes Cholecystectomy (N/A); Colonoscopy; Tonsillectomy; and Upper gastrointestinal  endoscopy.   Objective:   Vitals: BP (!) 142/86   Pulse 64   Temp 97.8 F (36.6 C) (Oral)   Resp 20   Ht 5' 6.93" (1.7 m)   Wt 185 lb 6.4 oz (84.1 kg)   SpO2 99%   BMI 29.10 kg/m   Physical Exam  Constitutional: He is oriented to person, place, and time. He appears well-developed and well-nourished. No distress.  HENT:  Head: Normocephalic and atraumatic.  Mouth/Throat: Uvula is midline, oropharynx is clear and moist and mucous membranes are normal. No tonsillar exudate.  Eyes: Pupils are equal, round, and reactive to light. Conjunctivae and EOM are normal.  Neck: Normal range of motion.  Cardiovascular: Normal rate, regular rhythm, normal heart sounds and intact distal pulses.  Pulmonary/Chest: Effort normal and breath sounds normal. He has no decreased breath sounds. He has no wheezes. He has no rhonchi. He has no rales.  Musculoskeletal:       Right lower leg: He exhibits no swelling.       Left lower leg: He exhibits no swelling.  Neurological: He is alert and oriented to person, place, and time.  Skin: Skin is warm and dry.  Psychiatric: He has a normal mood and affect.  Vitals reviewed.   No results found for this or any previous visit (from the past 24 hour(s)).   BP Readings from Last 3 Encounters:  08/20/18 (!) 142/86  03/30/18 124/66  03/22/18 118/76    Assessment and Plan :  1. Insomnia, unspecified type Discussed CBT and sleep hygiene for insomnia.Suspect untreated OSA was playing a role in patient's sleep issues.  He  has noticed improvement with the CPAP machine.  Not convinced that patient is getting much benefit from taking Ambien.  He is on the max dose.    Due to duration of therapy with Ambien, recommend slow taper.  Given patient enough medication for the next 2 weeks.  Recommend he contact me via mychart before he runs out of this Rx and I will prescribe a new Rx for lower dose for additional 2 weeks.  Follow-up in office in 4 weeks for  reevaluation.  2. Essential hypertension Uncontrolled in office but home BP readings are within normal limits.  Asymptomatic. Instructed to check bp outside of office over the next couple of weeks. Return if consistently >140/90. Given strict ED precautions.  - CBC with Differential/Platelet - CMP14+EGFR - TSH - Urinalysis, dipstick only  3. Elevated cholesterol - Lipid panel  4. Elevated TSH - T3, Free - T4, Free  5. Need for immunization against influenza - Flu Vaccine QUAD 36+ mos IM   Meds ordered this encounter  Medications  . zolpidem (AMBIEN) 10 MG tablet    Sig: Take 1 tablet (10 mg total) by mouth at bedtime as needed for 7 days for sleep, THEN 0.5 tablets (5 mg total) at bedtime as needed for up to 7 days for sleep.    Dispense:  11 tablet    Refill:  0    Order Specific Question:   Supervising Provider    Answer:   Horald Pollen [[id number]]    Tenna Delaine, PA-C  [facility] [place] 08/20/2018 2:04 PM

## 2018-08-21 LAB — CMP14+EGFR
ALK PHOS: 68 IU/L (ref 39–117)
ALT: 17 IU/L (ref 0–44)
AST: 17 IU/L (ref 0–40)
Albumin/Globulin Ratio: 1.7 (ref 1.2–2.2)
Albumin: 4.4 g/dL (ref 3.6–4.8)
BUN/Creatinine Ratio: 13 (ref 10–24)
BUN: 12 mg/dL (ref 8–27)
Bilirubin Total: 0.4 mg/dL (ref 0.0–1.2)
CALCIUM: 9.5 mg/dL (ref 8.6–10.2)
CO2: 23 mmol/L (ref 20–29)
CREATININE: 0.95 mg/dL (ref 0.76–1.27)
Chloride: 104 mmol/L (ref 96–106)
GFR calc Af Amer: 99 mL/min/{1.73_m2} (ref >59)
GFR, EST NON AFRICAN AMERICAN: 86 mL/min/{1.73_m2} (ref >59)
GLOBULIN, TOTAL: 2.6 g/dL (ref 1.5–4.5)
GLUCOSE: 89 mg/dL (ref 65–99)
Potassium: 4 mmol/L (ref 3.5–5.2)
Sodium: 145 mmol/L — ABNORMAL HIGH (ref 134–144)
Total Protein: 7 g/dL (ref 6.0–8.5)

## 2018-08-21 LAB — LIPID PANEL
CHOLESTEROL TOTAL: 226 mg/dL — AB (ref 100–199)
Chol/HDL Ratio: 3.4 ratio (ref 0.0–5.0)
HDL: 67 mg/dL (ref >39)
LDL Calculated: 115 mg/dL — ABNORMAL HIGH (ref 0–99)
TRIGLYCERIDES: 220 mg/dL — AB (ref 0–149)
VLDL CHOLESTEROL CAL: 44 mg/dL — AB (ref 5–40)

## 2018-08-21 LAB — URINALYSIS, DIPSTICK ONLY
BILIRUBIN UA: NEGATIVE
GLUCOSE, UA: NEGATIVE
Ketones, UA: NEGATIVE
Leukocytes, UA: NEGATIVE
Nitrite, UA: NEGATIVE
PH UA: 5 (ref 5.0–7.5)
Protein, UA: NEGATIVE
RBC, UA: NEGATIVE
Specific Gravity, UA: 1.025 (ref 1.005–1.030)
UUROB: 0.2 mg/dL (ref 0.2–1.0)

## 2018-08-21 LAB — T3, FREE: T3 FREE: 3.6 pg/mL (ref 2.0–4.4)

## 2018-08-21 LAB — CBC WITH DIFFERENTIAL/PLATELET
Basophils Absolute: 0 10*3/uL (ref 0.0–0.2)
Basos: 1 %
EOS (ABSOLUTE): 0.2 10*3/uL (ref 0.0–0.4)
EOS: 2 %
HEMATOCRIT: 45.5 % (ref 37.5–51.0)
HEMOGLOBIN: 15.8 g/dL (ref 13.0–17.7)
IMMATURE GRANULOCYTES: 0 %
Immature Grans (Abs): 0 10*3/uL (ref 0.0–0.1)
LYMPHS ABS: 2.2 10*3/uL (ref 0.7–3.1)
Lymphs: 27 %
MCH: 32.4 pg (ref 26.6–33.0)
MCHC: 34.7 g/dL (ref 31.5–35.7)
MCV: 93 fL (ref 79–97)
MONOCYTES: 7 %
MONOS ABS: 0.6 10*3/uL (ref 0.1–0.9)
NEUTROS PCT: 63 %
Neutrophils Absolute: 5.3 10*3/uL (ref 1.4–7.0)
Platelets: 192 10*3/uL (ref 150–450)
RBC: 4.88 x10E6/uL (ref 4.14–5.80)
RDW: 12.8 % (ref 12.3–15.4)
WBC: 8.4 10*3/uL (ref 3.4–10.8)

## 2018-08-21 LAB — TSH: TSH: 5.36 u[IU]/mL — ABNORMAL HIGH (ref 0.450–4.500)

## 2018-08-21 LAB — T4, FREE: FREE T4: 1.02 ng/dL (ref 0.82–1.77)

## 2018-08-31 ENCOUNTER — Other Ambulatory Visit: Payer: Self-pay | Admitting: Physician Assistant

## 2018-08-31 MED ORDER — ATORVASTATIN CALCIUM 20 MG PO TABS: 20.0000 mg | ORAL_TABLET | Freq: Every day | ORAL | 3 refills | Status: DC

## 2018-08-31 NOTE — Progress Notes (Signed)
Meds ordered this encounter  Medications  . atorvastatin (LIPITOR) 20 MG tablet    Sig: Take 1 tablet (20 mg total) by mouth daily.    Dispense:  90 tablet    Refill:  3    Order Specific Question:   Supervising Provider    Answer:   Horald Pollen [phone]

## 2018-09-03 ENCOUNTER — Other Ambulatory Visit: Payer: Self-pay | Admitting: Physician Assistant

## 2018-09-03 NOTE — Telephone Encounter (Signed)
Patient is requesting a refill of the following medications: Requested Prescriptions   Pending Prescriptions Disp Refills  . zolpidem (AMBIEN) 10 MG tablet 11 tablet 0    Sig: Take 1 tablet (10 mg total) by mouth at bedtime as needed for 7 days for sleep, THEN 0.5 tablets (5 mg total) at bedtime as needed for up to 7 days for sleep.    Date of patient request: 08/31/2018 Last office visit: 08/20/2018 Date of last refill: 08/20/2018 Last refill amount: 11 Follow up time period per chart:

## 2018-09-04 ENCOUNTER — Other Ambulatory Visit: Payer: Self-pay | Admitting: Physician Assistant

## 2018-09-04 ENCOUNTER — Encounter: Payer: Self-pay | Admitting: Physician Assistant

## 2018-09-04 MED ORDER — ZOLPIDEM TARTRATE 5 MG PO TABS: ORAL_TABLET | ORAL | 0 refills | Status: DC

## 2018-09-04 NOTE — Progress Notes (Signed)
Meds ordered this encounter  Medications  . zolpidem (AMBIEN) 5 MG tablet    Sig: Take 1 tablet by mouth at bedtime x 7 days, then decrease to 0.5 tablet by mouth at bedtime x 7 days.    Dispense:  11 tablet    Refill:  0    Order Specific Question:   Supervising Provider    Answer:   Horald Pollen [phone]

## 2018-09-09 ENCOUNTER — Encounter: Payer: Self-pay | Admitting: Neurology

## 2018-09-11 ENCOUNTER — Ambulatory Visit: Payer: BC Managed Care – PPO | Admitting: Neurology

## 2018-09-11 ENCOUNTER — Encounter: Payer: Self-pay | Admitting: Neurology

## 2018-09-11 VITALS — BP 130/77 | HR 74 | Ht 67.0 in | Wt 188.0 lb

## 2018-09-11 DIAGNOSIS — G4733 Obstructive sleep apnea (adult) (pediatric): Secondary | ICD-10-CM | POA: Diagnosis not present

## 2018-09-11 DIAGNOSIS — Z9989 Dependence on other enabling machines and devices: Secondary | ICD-10-CM

## 2018-09-11 NOTE — Patient Instructions (Addendum)
Please continue using your CPAP regularly. While your insurance requires that you use CPAP at least 4 hours each night on 70% of the nights, I recommend, that you not skip any nights and use it throughout the night if you can. Getting used to CPAP and staying with the treatment long term does take time and patience and discipline. Untreated obstructive sleep apnea when it is moderate to severe can have an adverse impact on cardiovascular health and raise her risk for heart disease, arrhythmias, hypertension, congestive heart failure, stroke and diabetes. Untreated obstructive sleep apnea causes sleep disruption, nonrestorative sleep, and sleep deprivation. This can have an impact on your day to day functioning and cause daytime sleepiness and impairment of cognitive function, memory loss, mood disturbance, and problems focussing. Using CPAP regularly can improve these symptoms.  The address for [facility] is: [address] [phone]. Please talk to them about replacement supplies and improving the air leakage.   You have done well on CPAP, please be sure to change your filter every 1-2 months, your mask about every 3 months, hose about 6 months, humidifier chamber about yearly or every 6 months. Some restrictions are imposed by her insurance carrier with regard to how frequently you can get certain supplies.   Keep up the good work! I will see you back in 6 months for sleep apnea check up, and if you continue to do well on CPAP I will see you once a year thereafter.

## 2018-09-11 NOTE — Progress Notes (Signed)
Subjective:    Patient ID: Dennis Orr is a 62 y.o. male.  HPI     Interim history:   Mr. Culbertson is a 62 year old right-handed gentleman with an underlying medical history of allergies, reflux disease, vitamin D deficiency, ulcerative colitis, hypertension, hyperlipidemia, and obesity, who presents for follow-up consultation of his obstructive sleep apnea after recent sleep study testing and starting CPAP therapy at home. The patient is unaccompanied today. I first met him on 03/22/2018 at the request of his primary care provider, at which time he reported a prior diagnosis of sleep apnea but he was no longer on CPAP therapy. He was advised to proceed with sleep study testing. He had a baseline sleep study, followed by a CPAP titration study. I went over his test results with him in detail today. Baseline sleep study from 05/14/2018 showed a sleep efficiency of 63.8%, sleep latency was delayed at 127 minutes, REM sleep was absent. Total AHI was in the severe range of 65.6 per hour, average oxygen saturation 94%, nadir during non-REM sleep was 83%. He had no significant PLMS. He was invited for a CPAP titration study, which he had on 06/01/2018. Sleep efficiency was 61.4%, sleep latency delayed, REM latency markedly delayed at 333 minutes. He was titrated via nasal mask from 5 cm to 8 cm. On the final pressure he had a AHI of 0 per hour with supine non-REM sleep achieved and O2 nadir of 94%. Based on his test results I prescribed CPAP therapy for home use.  Today, 09/11/2018: I reviewed his CPAP compliance data from 08/11/2018 through 09/09/2018 which is a total of 30 days, during which time he used his CPAP 29 days with percent used days greater than 4 hours at 83%, indicating very good compliance with an average usage of 5 hours and 9 minutes, residual AHI at goal at 1.3 per hour, leak on the high side with the 95th percentile at 24.2 L/m on a pressure of 8 cm with EPR of 3. He reports doing well,  feels a difference in his sleep quality and motivated to continue with CPAP. He has not had any change in supplies yet.   The patient's allergies, current medications, family history, past medical history, past social history, past surgical history and problem list were reviewed and updated as appropriate.   Previously:   03/22/2018: (He) was previously diagnosed with obstructive sleep apnea and placed on CPAP therapy. He no longer uses a CPAP. He had sleep study testing at [facility] sleep center in 2011. He had a baseline sleep study on 12/09/2009 which showed severe obstructive sleep apnea with a total AHI of 71.6 per hour, O2 nadir of 80%. He had a CPAP titration study on 01/01/2016. I reviewed your office note from 01/16/2018. He reports difficulty falling asleep and staying asleep. His Epworth sleepiness score is 5 out of 24, fatigue score is 32 out of 63. He is single, lives alone, works for [employer] system. He quit smoking in 1995, drinks about 12 beers per week and Caffeine in the form of coffee, 2 cups per day on average.  His biggest sleep-related complaint is difficulty going to sleep and has required medication for this. He has been on Ambien for years. He's currently taking long-acting Ambien 12.5 mg each night. He works in Estate manager/land agent. Bedtime is between 9 and 11 at the latest, rise time around 6. He is not aware of any family history of obstructive sleep apnea. He had a tonsillectomy  as a child. Weight has been fairly stable. He has nocturia about once per average night and denies morning headaches with the exception of sinus headaches from time to time. He had difficulty with CPAP in the past, has not used it for 5 or 6 years. He had difficulty tolerating the full facemask as well as the pressure. The pressure was reduced one time as he recalls that he could still not tolerated. He would be willing to get checked out for sleep apnea again and consider trying CPAP again  especially if he were to be able to use a nasal interface.  His Past Medical History Is Significant For: Past Medical History:  Diagnosis Date  . Allergy   . Barrett's esophagus   . GERD (gastroesophageal reflux disease)    occ and uses nexium for this prn only  . Hx of adenomatous colonic polyps 01/13/2016  . Hyperlipidemia   . Low bone density 03/20/2016  . Ulcerative colitis ([place])   . Vitamin D deficiency 03/10/2016    His Past Surgical History Is Significant For: Past Surgical History:  Procedure Laterality Date  . CHOLECYSTECTOMY N/A   . COLONOSCOPY    . TONSILLECTOMY    . UPPER GASTROINTESTINAL ENDOSCOPY      His Family History Is Significant For: Family History  Problem Relation Age of Onset  . Colon cancer Mother   . Breast cancer Sister   . Hyperlipidemia Sister   . Colon polyps Neg Hx   . Esophageal cancer Neg Hx   . Stomach cancer Neg Hx   . Rectal cancer Neg Hx     His Social History Is Significant For: Social History   Socioeconomic History  . Marital status: Single    Spouse name: Not on file  . Number of children: 0  . Years of education: Not on file  . Highest education level: Not on file  Occupational History  . Occupation: Multimedia programmer: [employer]  . Financial resource strain: Not on file  . Food insecurity:    Worry: Not on file    Inability: Not on file  . Transportation needs:    Medical: Not on file    Non-medical: Not on file  Tobacco Use  . Smoking status: Former Smoker    Packs/day: 0.50    Years: 10.00    Pack years: 5.00    Last attempt to quit: 11/14/1994    Years since quitting: 23.8  . Smokeless tobacco: Never Used  Substance and Sexual Activity  . Alcohol use: Yes    Alcohol/week: 18.0 standard drinks    Types: 18 Standard drinks or equivalent per week    Comment: up to 18 beers a week - 3-4 a night on nights that he drinks  . Drug use: No  . Sexual activity: Yes    Partners: Female  Lifestyle  .  Physical activity:    Days per week: Not on file    Minutes per session: Not on file  . Stress: Not on file  Relationships  . Social connections:    Talks on phone: Not on file    Gets together: Not on file    Attends religious service: Not on file    Active member of club or organization: Not on file    Attends meetings of clubs or organizations: Not on file    Relationship status: Not on file  Other Topics Concern  . Not on file  Social History  Narrative   Single - self-employed [employer]   4 EtOH/day   1 caffeine/day    His Allergies Are:  No Known Allergies:   His Current Medications Are:  Outpatient Encounter Medications as of 09/11/2018  Medication Sig  . atorvastatin (LIPITOR) 20 MG tablet Take 1 tablet (20 mg total) by mouth daily.  Marland Kitchen olmesartan (BENICAR) 20 MG tablet Take 1 tablet (20 mg total) by mouth daily.  Marland Kitchen zolpidem (AMBIEN) 5 MG tablet Take 1 tablet by mouth at bedtime x 7 days, then decrease to 0.5 tablet by mouth at bedtime x 7 days.  . [EXPIRED] zolpidem (AMBIEN) 10 MG tablet Take 1 tablet (10 mg total) by mouth at bedtime as needed for 7 days for sleep, THEN 0.5 tablets (5 mg total) at bedtime as needed for up to 7 days for sleep.   No facility-administered encounter medications on file as of 09/11/2018.   :  Review of Systems:  Out of a complete 14 point review of systems, all are reviewed and negative with the exception of these symptoms as listed below: Review of Systems  Neurological:       Pt presents today to discuss his cpap. Pt reports that his cpap is going well and he thinks he is breathing better.    Objective:  Neurological Exam  Physical Exam Physical Examination:   Vitals:   09/11/18 1024  BP: 130/77  Pulse: 74    General Examination: The patient is a very pleasant 63 y.o. male in no acute distress. He appears well-developed and well-nourished and well groomed.   HEENT: Normocephalic, atraumatic, pupils are equal, round  and reactive to light and accommodation. Extraocular tracking is good without limitation to gaze excursion or nystagmus noted. Normal smooth pursuit is noted. Hearing is grossly intact. Face is symmetric with normal facial animation and normal facial sensation. Speech is clear with no dysarthria noted. There is no hypophonia. There is no lip, neck/head, jaw or voice tremor. Neck shows FROM. Oropharynx exam reveals: mild mouth dryness, adequate dental hygiene and marked airway crowding. Tongue protrudes centrally and palate elevates symmetrically. Tonsils are absent.   Chest: Clear to auscultation without wheezing, rhonchi or crackles noted.  Heart: S1+S2+0, regular and normal without murmurs, rubs or gallops noted.   Abdomen: Soft, non-tender and non-distended with normal bowel sounds appreciated on auscultation.  Extremities: There is no pitting edema in the distal lower extremities bilaterally.   Skin: Warm and dry without trophic changes noted.  Musculoskeletal: exam reveals no obvious joint deformities, tenderness or joint swelling or erythema.   Neurologically:  Mental status: The patient is awake, alert and oriented in all 4 spheres. His immediate and remote memory, attention, language skills and fund of knowledge are appropriate. There is no evidence of aphasia, agnosia, apraxia or anomia. Speech is clear with normal prosody and enunciation. Thought process is linear. Mood is normal and affect is normal.  Cranial nerves II - XII are as described above under HEENT exam. In addition: shoulder shrug is normal with equal shoulder height noted. Motor exam: Normal bulk, strength and tone is noted. There is no tremor. Romberg is negative. Fine motor skills and coordination: grossly intact.  Cerebellar testing: No dysmetria or intention tremor.   Sensory exam: intact to light touch in the upper and lower extremities.  Gait, station and balance: He stands easily. No veering to one side is  noted. No leaning to one side is noted. Posture is age-appropriate and stance is narrow based.  Gait shows normal stride length and normal pace. No problems turning are noted.  Tandem walk is challenging for him, better today.    Assessment and Plan:   In summary, Keefer Soulliere is a very pleasant 62 year old male with an underlying medical history of allergies, reflux disease, vitamin D deficiency, ulcerative colitis, hypertension, hyperlipidemia, and obesity, who presents for follow-up consultation of his severe obstructive sleep apnea as determined by his sleep study on 05/14/2018. He did well with CPAP during his second study on 06/01/2018 and has been compliant with CPAP, indicating good results as well. He is commended for his treatment adherence and encouraged to be fully compliant with CPAP therapy. He feels that his sleep quality and sleep consolidation are better. He does have a history of sleep difficulties and has been on Ambien, currently no longer on it. He needs new supplies and is advised to get in touch with his DME company as soon as possible. He is advised to follow-up routinely in 6 months, sooner if needed. Hopefully, he will be able to be seen routinely in yearly pattern after that. I answered all his questions today and he was in agreement.

## 2018-09-15 ENCOUNTER — Encounter: Payer: Self-pay | Admitting: Physician Assistant

## 2018-09-19 ENCOUNTER — Other Ambulatory Visit: Payer: Self-pay | Admitting: Physician Assistant

## 2018-09-19 MED ORDER — TRAZODONE HCL 50 MG PO TABS: 25.0000 mg | ORAL_TABLET | Freq: Every evening | ORAL | 1 refills | Status: DC | PRN

## 2018-09-19 NOTE — Progress Notes (Signed)
Meds ordered this encounter  Medications  . traZODone (DESYREL) 50 MG tablet    Sig: Take 0.5-1 tablets (25-50 mg total) by mouth at bedtime as needed for sleep.    Dispense:  90 tablet    Refill:  1    Order Specific Question:   Supervising Provider    Answer:   Horald Pollen [id number]

## 2018-09-24 ENCOUNTER — Other Ambulatory Visit: Payer: Self-pay

## 2018-09-24 ENCOUNTER — Ambulatory Visit: Payer: BC Managed Care – PPO | Admitting: Physician Assistant

## 2018-09-24 ENCOUNTER — Encounter: Payer: Self-pay | Admitting: Physician Assistant

## 2018-09-24 VITALS — BP 123/76 | HR 73 | Temp 98.0°F | Resp 20 | Ht 67.09 in | Wt 190.6 lb

## 2018-09-24 DIAGNOSIS — G4700 Insomnia, unspecified: Secondary | ICD-10-CM

## 2018-09-24 DIAGNOSIS — Z23 Encounter for immunization: Secondary | ICD-10-CM

## 2018-09-24 MED ORDER — TRAZODONE HCL 50 MG PO TABS: 100.0000 mg | ORAL_TABLET | Freq: Every evening | ORAL | 1 refills | Status: DC | PRN

## 2018-09-24 NOTE — Progress Notes (Signed)
   Dennis Orr  MRN: [id number] DOB: 08-24-1956  Subjective:  Dennis Orr is a 62 y.o. male seen in office today for a chief complaint of f/u on insomnia. Last seen in office on 08/20/18. Tapered off ambien. Started on trazodone. Today, reports feeling much better being off ambien nightly. Sleep has improved. Has only been on trazodone for 4 nights. Just increased to 45m last night. Took it right when he went to bed and noticed it took an hour to take effect. Got about 6 hours of good rest. Using cpap nightly. Denies headache, n/v/d, confusion, SI, and HI. No other questions or concerns.   Review of Systems Per HPI Patient Active Problem List   Diagnosis Date Noted  . Insomnia 06/21/2017  . Elevated cholesterol 05/15/2017  . Elevated BP without diagnosis of hypertension 05/15/2017  . History of sleep apnea 05/15/2017  . Low bone density 03/20/2016  . Vitamin D deficiency 03/10/2016  . Hx of adenomatous colonic polyps 01/13/2016  . Barrett's esophagus - NOT - egd 12/2015 no changes 12/09/2015  . [place] 07[place]    Current Outpatient Medications on File Prior to Visit  Medication Sig Dispense Refill  . atorvastatin (LIPITOR) 20 MG tablet Take 1 tablet (20 mg total) by mouth daily. 90 tablet 3  . olmesartan (BENICAR) 20 MG tablet Take 1 tablet (20 mg total) by mouth daily. 90 tablet 1  . traZODone (DESYREL) 50 MG tablet Take 0.5-1 tablets (25-50 mg total) by mouth at bedtime as needed for sleep. 90 tablet 1  . zolpidem (AMBIEN) 5 MG tablet Take 1 tablet by mouth at bedtime x 7 days, then decrease to 0.5 tablet by mouth at bedtime x 7 days. (Patient not taking: Reported on 09/24/2018) 11 tablet 0   No current facility-administered medications on file prior to visit.     No Known Allergies   Objective:  BP 123/76   Pulse 73   Temp 98 F (36.7 C) (Oral)   Resp 20   Ht 5' 7.09" (1.704 m)   Wt 190 lb 9.6 oz (86.5 kg)   SpO2 98%   BMI 29.77 kg/m   Physical  Exam  Constitutional: He is oriented to person, place, and time. He appears well-developed and well-nourished.  HENT:  Head: Normocephalic and atraumatic.  Eyes: Conjunctivae are normal.  Neck: Normal range of motion.  Pulmonary/Chest: Effort normal.  Neurological: He is alert and oriented to person, place, and time.  Skin: Skin is warm and dry.  Psychiatric: He has a normal mood and affect.  Vitals reviewed.   Assessment and Plan :  1. Insomnia, unspecified type Tolerating trazodone well. Rec continuing with medication and sleep hygiene. Educated on trazodone dosing.  F/u as needed.  Meds ordered this encounter  Medications  . traZODone (DESYREL) 50 MG tablet    Sig: Take 2 tablets (100 mg total) by mouth at bedtime as needed for sleep.    Dispense:  180 tablet    Refill:  1    Order Specific Question:   Supervising Provider    Answer:   SHorald Pollen[[[id number]    BTenna DelainePA-C  [facility] [place]11/09/2018 3:48 PM

## 2018-09-24 NOTE — Patient Instructions (Addendum)
For trazodone dosing: increase the dose by 57m (1/2 pill) every 4-5 nights until either you sleep well, have side effects or reach a nightly dose of 1573mTake 1 hour before wanting to go to sleep.   If you have lab work done today you will be contacted with your lab results within the next 2 weeks.  If you have not heard from us[place] please contact usKoreaThe fastest way to get your results is to register for My Chart.   IF you received an x-ray today, you will receive an invoice from [facility]. Please contact [facility] at [phone] questions or concerns regarding your invoice.   IF you received labwork today, you will receive an invoice from [place] at [phone] questions or concerns regarding your invoice.   Our billing staff will not be able to assist you with questions regarding bills from these companies.  You will be contacted with the lab results as soon as they are available. The fastest way to get your results is to activate your My Chart account. Instructions are located on the last page of this paperwork. If you have not heard from usKoreaegarding the results in 2 weeks, please contact this office.

## 2018-10-05 ENCOUNTER — Ambulatory Visit: Payer: BC Managed Care – PPO | Admitting: Physician Assistant

## 2019-02-13 ENCOUNTER — Other Ambulatory Visit: Payer: Self-pay

## 2019-02-13 DIAGNOSIS — I10 Essential (primary) hypertension: Secondary | ICD-10-CM

## 2019-02-13 MED ORDER — OLMESARTAN MEDOXOMIL 20 MG PO TABS: 20.0000 mg | ORAL_TABLET | Freq: Every day | ORAL | 0 refills | Status: DC

## 2019-02-13 NOTE — Telephone Encounter (Signed)
Requested Prescriptions  Pending Prescriptions Disp Refills  . olmesartan (BENICAR) 20 MG tablet 90 tablet 0    Sig: Take 1 tablet (20 mg total) by mouth daily.     Cardiovascular:  Angiotensin Receptor Blockers Passed - 02/13/2019 10:58 AM      Passed - Cr in normal range and within 180 days    Creat  Date Value Ref Range Status  10/29/2015 0.83 0.70 - 1.33 mg/dL Final   Creatinine, Ser  Date Value Ref Range Status  08/20/2018 0.95 0.76 - 1.27 mg/dL Final         Passed - K in normal range and within 180 days    Potassium  Date Value Ref Range Status  08/20/2018 4.0 3.5 - 5.2 mmol/L Final         Passed - Patient is not pregnant      Passed - Last BP in normal range    BP Readings from Last 1 Encounters:  09/24/18 123/76         Passed - Valid encounter within last 6 months    Recent Outpatient Visits          4 months ago Insomnia, unspecified type   [facility] [place], [place] D, PA-C   5 months ago Insomnia, unspecified type   [facility] [place], [place] D, PA-C   10 months ago Essential hypertension   [facility] [place], PA-C   12 months ago Essential hypertension   [facility], Audrie Lia, PA-C   1 year ago Essential hypertension   [facility], Damaris Hippo, PA-C

## 2019-03-13 ENCOUNTER — Ambulatory Visit: Payer: BC Managed Care – PPO | Admitting: Neurology

## 2019-05-07 ENCOUNTER — Other Ambulatory Visit: Payer: Self-pay | Admitting: Family Medicine

## 2019-05-07 DIAGNOSIS — I10 Essential (primary) hypertension: Secondary | ICD-10-CM

## 2019-05-30 ENCOUNTER — Other Ambulatory Visit: Payer: Self-pay | Admitting: Family Medicine

## 2019-05-30 DIAGNOSIS — I10 Essential (primary) hypertension: Secondary | ICD-10-CM

## 2019-05-30 NOTE — Telephone Encounter (Signed)
Requested medication (s) are due for refill today:   Yes  Requested medication (s) are on the active medication list:   Yes  Future visit scheduled:   No needs OV for refills.   Had 30 day courtesy in June, 2020   Last ordered: 05/07/2019  #30   0 refills   Requested Prescriptions  Pending Prescriptions Disp Refills   olmesartan (BENICAR) 20 MG tablet [Pharmacy Med Name: OLMESARTAN MEDOXOMIL 20 MG TAB] 30 tablet 0    Sig: Take 1 tablet (20 mg total) by mouth daily. Schedule Office Visit     Cardiovascular:  Angiotensin Receptor Blockers Failed - 05/30/2019 11:32 AM      Failed - Cr in normal range and within 180 days    Creat  Date Value Ref Range Status  10/29/2015 0.83 0.70 - 1.33 mg/dL Final   Creatinine, Ser  Date Value Ref Range Status  08/20/2018 0.95 0.76 - 1.27 mg/dL Final         Failed - K in normal range and within 180 days    Potassium  Date Value Ref Range Status  08/20/2018 4.0 3.5 - 5.2 mmol/L Final         Failed - Valid encounter within last 6 months    Recent Outpatient Visits          8 months ago Insomnia, unspecified type   [facility] [place], [place] D, PA-C   9 months ago Insomnia, unspecified type   [facility] [place], [place] D, PA-C   1 year ago Essential hypertension   [facility], Damaris Hippo, PA-C   1 year ago Essential hypertension   [facility], Audrie Lia, PA-C   1 year ago Essential hypertension   [facility], Damaris Hippo, PA-C             Passed - Patient is not pregnant      Passed - Last BP in normal range    BP Readings from Last 1 Encounters:  09/24/18 123/76

## 2019-06-30 ENCOUNTER — Other Ambulatory Visit: Payer: Self-pay | Admitting: Family Medicine

## 2019-06-30 DIAGNOSIS — I10 Essential (primary) hypertension: Secondary | ICD-10-CM

## 2019-08-27 ENCOUNTER — Encounter: Payer: Self-pay | Admitting: Adult Health Nurse Practitioner

## 2019-08-27 ENCOUNTER — Ambulatory Visit (INDEPENDENT_AMBULATORY_CARE_PROVIDER_SITE_OTHER): Payer: BC Managed Care – PPO | Admitting: Adult Health Nurse Practitioner

## 2019-08-27 ENCOUNTER — Other Ambulatory Visit: Payer: Self-pay

## 2019-08-27 VITALS — BP 163/89 | HR 76 | Temp 98.6°F | Ht 67.0 in | Wt 189.0 lb

## 2019-08-27 DIAGNOSIS — R7989 Other specified abnormal findings of blood chemistry: Secondary | ICD-10-CM | POA: Diagnosis not present

## 2019-08-27 DIAGNOSIS — E7800 Pure hypercholesterolemia, unspecified: Secondary | ICD-10-CM

## 2019-08-27 DIAGNOSIS — M8580 Other specified disorders of bone density and structure, unspecified site: Secondary | ICD-10-CM

## 2019-08-27 DIAGNOSIS — G4700 Insomnia, unspecified: Secondary | ICD-10-CM

## 2019-08-27 DIAGNOSIS — I10 Essential (primary) hypertension: Secondary | ICD-10-CM | POA: Diagnosis not present

## 2019-08-27 DIAGNOSIS — Z8669 Personal history of other diseases of the nervous system and sense organs: Secondary | ICD-10-CM

## 2019-08-27 DIAGNOSIS — E559 Vitamin D deficiency, unspecified: Secondary | ICD-10-CM

## 2019-08-27 DIAGNOSIS — M25512 Pain in left shoulder: Secondary | ICD-10-CM | POA: Diagnosis not present

## 2019-08-27 DIAGNOSIS — G4733 Obstructive sleep apnea (adult) (pediatric): Secondary | ICD-10-CM

## 2019-08-27 DIAGNOSIS — Z86010 Personal history of colonic polyps: Secondary | ICD-10-CM

## 2019-08-27 DIAGNOSIS — K2270 Barrett's esophagus without dysplasia: Secondary | ICD-10-CM

## 2019-08-27 DIAGNOSIS — K5190 Ulcerative colitis, unspecified, without complications: Secondary | ICD-10-CM

## 2019-08-27 DIAGNOSIS — M859 Disorder of bone density and structure, unspecified: Secondary | ICD-10-CM

## 2019-08-27 HISTORY — DX: Essential (primary) hypertension: I10

## 2019-08-27 HISTORY — DX: Pain in left shoulder: M25.512

## 2019-08-27 MED ORDER — OLMESARTAN MEDOXOMIL 20 MG PO TABS: 20.0000 mg | ORAL_TABLET | Freq: Every day | ORAL | 0 refills | Status: DC

## 2019-08-27 MED ORDER — ATORVASTATIN CALCIUM 20 MG PO TABS: 20.0000 mg | ORAL_TABLET | Freq: Every day | ORAL | 3 refills | Status: DC

## 2019-08-27 MED ORDER — ZOLPIDEM TARTRATE 10 MG PO TABS: 10.0000 mg | ORAL_TABLET | Freq: Every evening | ORAL | 1 refills | Status: DC | PRN

## 2019-08-27 NOTE — Patient Instructions (Signed)
Shoulder Pain Many things can cause shoulder pain, including:  An injury to the shoulder.  Overuse of the shoulder.  Arthritis. The source of the pain can be:  Inflammation.  An injury to the shoulder joint.  An injury to a tendon, ligament, or bone. Follow these instructions at home: Pay attention to changes in your symptoms. Let your health care provider know about them. Follow these instructions to relieve your pain. If you have a sling:  Wear the sling as told by your health care provider. Remove it only as told by your health care provider.  Loosen the sling if your fingers tingle, become numb, or turn cold and blue.  Keep the sling clean.  If the sling is not waterproof: ? Do not let it get wet. Remove it to shower or bathe.  Move your arm as little as possible, but keep your hand moving to prevent swelling. Managing pain, stiffness, and swelling   If directed, put ice on the painful area: ? Put ice in a plastic bag. ? Place a towel between your skin and the bag. ? Leave the ice on for 20 minutes, 2-3 times per day. Stop applying ice if it does not help with the pain.  Squeeze a soft ball or a foam pad as much as possible. This helps to keep the shoulder from swelling. It also helps to strengthen the arm. General instructions  Take over-the-counter and prescription medicines only as told by your health care provider.  Keep all follow-up visits as told by your health care provider. This is important. Contact a health care provider if:  Your pain gets worse.  Your pain is not relieved with medicines.  New pain develops in your arm, hand, or fingers. Get help right away if:  Your arm, hand, or fingers: ? Tingle. ? Become numb. ? Become swollen. ? Become painful. ? Turn white or blue. Summary  Shoulder pain can be caused by an injury, overuse, or arthritis.  Pay attention to changes in your symptoms. Let your health care provider know about them.   This condition may be treated with a sling, ice, and pain medicines.  Contact your health care provider if the pain gets worse or new pain develops. Get help right away if your arm, hand, or fingers tingle or become numb, swollen, or painful.  Keep all follow-up visits as told by your health care provider. This is important. This information is not intended to replace advice given to you by your health care provider. Make sure you discuss any questions you have with your health care provider. Document Released: 08/10/2005 Document Revised: 05/15/2018 Document Reviewed: 05/15/2018 Elsevier Patient Education  2020 [employer].  Managing Your Hypertension Hypertension is commonly called high blood pressure. This is when the force of your blood pressing against the walls of your arteries is too strong. Arteries are blood vessels that carry blood from your heart throughout your body. Hypertension forces the heart to work harder to pump blood, and may cause the arteries to become narrow or stiff. Having untreated or uncontrolled hypertension can cause heart attack, stroke, kidney disease, and other problems. What are blood pressure readings? A blood pressure reading consists of a higher number over a lower number. Ideally, your blood pressure should be below 120/80. The first ("top") number is called the systolic pressure. It is a measure of the pressure in your arteries as your heart beats. The second ("bottom") number is called the diastolic pressure. It is a measure  of the pressure in your arteries as the heart relaxes. What does my blood pressure reading mean? Blood pressure is classified into four stages. Based on your blood pressure reading, your health care provider may use the following stages to determine what type of treatment you need, if any. Systolic pressure and diastolic pressure are measured in a unit called mm Hg. Normal  Systolic pressure: below 154.  Diastolic pressure: below 80.  Elevated  Systolic pressure: 008-676.  Diastolic pressure: below 80. Hypertension stage 1  Systolic pressure: 195-093.  Diastolic pressure: 26-71. Hypertension stage 2  Systolic pressure: 245 or above.  Diastolic pressure: 90 or above. What health risks are associated with hypertension? Managing your hypertension is an important responsibility. Uncontrolled hypertension can lead to:  A heart attack.  A stroke.  A weakened blood vessel (aneurysm).  Heart failure.  Kidney damage.  Eye damage.  Metabolic syndrome.  Memory and concentration problems. What changes can I make to manage my hypertension? Hypertension can be managed by making lifestyle changes and possibly by taking medicines. Your health care provider will help you make a plan to bring your blood pressure within a normal range. Eating and drinking   Eat a diet that is high in fiber and potassium, and low in salt (sodium), added sugar, and fat. An example eating plan is called the DASH (Dietary Approaches to Stop Hypertension) diet. To eat this way: ? Eat plenty of fresh fruits and vegetables. Try to fill half of your plate at each meal with fruits and vegetables. ? Eat whole grains, such as whole wheat pasta, brown rice, or whole grain bread. Fill about one quarter of your plate with whole grains. ? Eat low-fat diary products. ? Avoid fatty cuts of meat, processed or cured meats, and poultry with skin. Fill about one quarter of your plate with lean proteins such as fish, chicken without skin, beans, eggs, and tofu. ? Avoid premade and processed foods. These tend to be higher in sodium, added sugar, and fat.  Reduce your daily sodium intake. Most people with hypertension should eat less than 1,500 mg of sodium a day.  Limit alcohol intake to no more than 1 drink a day for nonpregnant women and 2 drinks a day for men. One drink equals 12 oz of beer, 5 oz of wine, or 1 oz of hard liquor. Lifestyle  Work with  your health care provider to maintain a healthy body weight, or to lose weight. Ask what an ideal weight is for you.  Get at least 30 minutes of exercise that causes your heart to beat faster (aerobic exercise) most days of the week. Activities may include walking, swimming, or biking.  Include exercise to strengthen your muscles (resistance exercise), such as weight lifting, as part of your weekly exercise routine. Try to do these types of exercises for 30 minutes at least 3 days a week.  Do not use any products that contain nicotine or tobacco, such as cigarettes and e-cigarettes. If you need help quitting, ask your health care provider.  Control any long-term (chronic) conditions you have, such as high cholesterol or diabetes. Monitoring  Monitor your blood pressure at home as told by your health care provider. Your personal target blood pressure may vary depending on your medical conditions, your age, and other factors.  Have your blood pressure checked regularly, as often as told by your health care provider. Working with your health care provider  Review all the medicines you take with your health  care provider because there may be side effects or interactions.  Talk with your health care provider about your diet, exercise habits, and other lifestyle factors that may be contributing to hypertension.  Visit your health care provider regularly. Your health care provider can help you create and adjust your plan for managing hypertension. Will I need medicine to control my blood pressure? Your health care provider may prescribe medicine if lifestyle changes are not enough to get your blood pressure under control, and if:  Your systolic blood pressure is 130 or higher.  Your diastolic blood pressure is 80 or higher. Take medicines only as told by your health care provider. Follow the directions carefully. Blood pressure medicines must be taken as prescribed. The medicine does not work as  well when you skip doses. Skipping doses also puts you at risk for problems. Contact a health care provider if:  You think you are having a reaction to medicines you have taken.  You have repeated (recurrent) headaches.  You feel dizzy.  You have swelling in your ankles.  You have trouble with your vision. Get help right away if:  You develop a severe headache or confusion.  You have unusual weakness or numbness, or you feel faint.  You have severe pain in your chest or abdomen.  You vomit repeatedly.  You have trouble breathing. Summary  Hypertension is when the force of blood pumping through your arteries is too strong. If this condition is not controlled, it may put you at risk for serious complications.  Your personal target blood pressure may vary depending on your medical conditions, your age, and other factors. For most people, a normal blood pressure is less than 120/80.  Hypertension is managed by lifestyle changes, medicines, or both. Lifestyle changes include weight loss, eating a healthy, low-sodium diet, exercising more, and limiting alcohol. This information is not intended to replace advice given to you by your health care provider. Make sure you discuss any questions you have with your health care provider. Document Released: 07/25/2012 Document Revised: 02/22/2019 Document Reviewed: 09/28/2016 Elsevier Patient Education  2020 [employer].  Managing Your Hypertension Hypertension is commonly called high blood pressure. This is when the force of your blood pressing against the walls of your arteries is too strong. Arteries are blood vessels that carry blood from your heart throughout your body. Hypertension forces the heart to work harder to pump blood, and may cause the arteries to become narrow or stiff. Having untreated or uncontrolled hypertension can cause heart attack, stroke, kidney disease, and other problems. What are blood pressure readings? A blood  pressure reading consists of a higher number over a lower number. Ideally, your blood pressure should be below 120/80. The first ("top") number is called the systolic pressure. It is a measure of the pressure in your arteries as your heart beats. The second ("bottom") number is called the diastolic pressure. It is a measure of the pressure in your arteries as the heart relaxes. What does my blood pressure reading mean? Blood pressure is classified into four stages. Based on your blood pressure reading, your health care provider may use the following stages to determine what type of treatment you need, if any. Systolic pressure and diastolic pressure are measured in a unit called mm Hg. Normal  Systolic pressure: below 284.  Diastolic pressure: below 80. Elevated  Systolic pressure: 132-440.  Diastolic pressure: below 80. Hypertension stage 1  Systolic pressure: 102-725.  Diastolic pressure: 36-64. Hypertension stage 2  Systolic pressure: 025 or above.  Diastolic pressure: 90 or above. What health risks are associated with hypertension? Managing your hypertension is an important responsibility. Uncontrolled hypertension can lead to:  A heart attack.  A stroke.  A weakened blood vessel (aneurysm).  Heart failure.  Kidney damage.  Eye damage.  Metabolic syndrome.  Memory and concentration problems. What changes can I make to manage my hypertension? Hypertension can be managed by making lifestyle changes and possibly by taking medicines. Your health care provider will help you make a plan to bring your blood pressure within a normal range. Eating and drinking   Eat a diet that is high in fiber and potassium, and low in salt (sodium), added sugar, and fat. An example eating plan is called the DASH (Dietary Approaches to Stop Hypertension) diet. To eat this way: ? Eat plenty of fresh fruits and vegetables. Try to fill half of your plate at each meal with fruits and  vegetables. ? Eat whole grains, such as whole wheat pasta, brown rice, or whole grain bread. Fill about one quarter of your plate with whole grains. ? Eat low-fat diary products. ? Avoid fatty cuts of meat, processed or cured meats, and poultry with skin. Fill about one quarter of your plate with lean proteins such as fish, chicken without skin, beans, eggs, and tofu. ? Avoid premade and processed foods. These tend to be higher in sodium, added sugar, and fat.  Reduce your daily sodium intake. Most people with hypertension should eat less than 1,500 mg of sodium a day.  Limit alcohol intake to no more than 1 drink a day for nonpregnant women and 2 drinks a day for men. One drink equals 12 oz of beer, 5 oz of wine, or 1 oz of hard liquor. Lifestyle  Work with your health care provider to maintain a healthy body weight, or to lose weight. Ask what an ideal weight is for you.  Get at least 30 minutes of exercise that causes your heart to beat faster (aerobic exercise) most days of the week. Activities may include walking, swimming, or biking.  Include exercise to strengthen your muscles (resistance exercise), such as weight lifting, as part of your weekly exercise routine. Try to do these types of exercises for 30 minutes at least 3 days a week.  Do not use any products that contain nicotine or tobacco, such as cigarettes and e-cigarettes. If you need help quitting, ask your health care provider.  Control any long-term (chronic) conditions you have, such as high cholesterol or diabetes. Monitoring  Monitor your blood pressure at home as told by your health care provider. Your personal target blood pressure may vary depending on your medical conditions, your age, and other factors.  Have your blood pressure checked regularly, as often as told by your health care provider. Working with your health care provider  Review all the medicines you take with your health care provider because there may  be side effects or interactions.  Talk with your health care provider about your diet, exercise habits, and other lifestyle factors that may be contributing to hypertension.  Visit your health care provider regularly. Your health care provider can help you create and adjust your plan for managing hypertension. Will I need medicine to control my blood pressure? Your health care provider may prescribe medicine if lifestyle changes are not enough to get your blood pressure under control, and if:  Your systolic blood pressure is 130 or higher.  Your diastolic blood  pressure is 80 or higher. Take medicines only as told by your health care provider. Follow the directions carefully. Blood pressure medicines must be taken as prescribed. The medicine does not work as well when you skip doses. Skipping doses also puts you at risk for problems. Contact a health care provider if:  You think you are having a reaction to medicines you have taken.  You have repeated (recurrent) headaches.  You feel dizzy.  You have swelling in your ankles.  You have trouble with your vision. Get help right away if:  You develop a severe headache or confusion.  You have unusual weakness or numbness, or you feel faint.  You have severe pain in your chest or abdomen.  You vomit repeatedly.  You have trouble breathing. Summary  Hypertension is when the force of blood pumping through your arteries is too strong. If this condition is not controlled, it may put you at risk for serious complications.  Your personal target blood pressure may vary depending on your medical conditions, your age, and other factors. For most people, a normal blood pressure is less than 120/80.  Hypertension is managed by lifestyle changes, medicines, or both. Lifestyle changes include weight loss, eating a healthy, low-sodium diet, exercising more, and limiting alcohol. This information is not intended to replace advice given to you by  your health care provider. Make sure you discuss any questions you have with your health care provider. Document Released: 07/25/2012 Document Revised: 02/22/2019 Document Reviewed: 09/28/2016 Elsevier Patient Education  2020 [employer].

## 2019-08-27 NOTE — Progress Notes (Signed)
Chief Complaint  Patient presents with  . Annual Exam  . Hypertension    pt stated have not taken his B/P meds for 2 days due having possible symptoms of arm/shouder pain.    HPI   Dennis Orr is a pleasant, 63 year old male here today for his annual physical exam and evaluation of chronic medical problems. Acute complaints today include shoulder pain described below as well as hx of Barrett's Esophagus and Ulcerative Colitis (on no medications) controlled with hx of abnormal colonoscopy and family hx of colon cancer in his mother who is deceased from this.  He is setting up his colonoscopy with his GI physician.  I did not see an order for an endoscopy and given hx of pre-cancerous changes, is due for screening.  He acknowledged he will get them together.  Denies any change in bowel/bladder habits.  No abdominal pain.  No rectal bleeding or blood in stool.   Denies symptoms of GERD, indigestion, or Etoh abuse.    Exercises at work.  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  Job is active.  He enjoys fishing as a hobby.  He is single, without children, and lives alone.  Denies symptoms of depression or anxiety.      Hypertension: Patient here for follow-up of elevated blood pressure. He is exercising and is not adherent to low salt diet.  Blood pressure is not well controlled at home.   Patient stopped Benicar due to association between shoulder pain and medication.  BP is elevated, not controlled.  May have been off longer than 2 days.  Patient reassured that shoulder pain not secondary to Benicar.  No associated chest pain, pressure, SOB.  Denies swelling in feet or ankles.  Cardiac symptoms none. Patient denies none.  Cardiovascular risk factors: advanced age (older than 54 for men, 67 for women), dyslipidemia, hypertension and male gender. Use of agents associated with hypertension: none. History of target organ damage: none. BP Readings from Last 3  Encounters:  08/27/19 (!) 163/89  09/24/18 123/76  09/11/18 130/77    Hyperlipidemia: Patient presents with hyperlipidemia.  He was tested because of routine screening. negative. There is a family history of hyperlipidemia. There is not a family history of early ischemia heart disease. Patient reports he can only tolerate statin for 8 weeks at a time, then he gets body aches and myalgias.  Will stop for a period of time before restarting. Unclear length of time without statin recently but over 1.5 months.  We discussed alternate dosing of statin to every other day or 3x a week in order to receive CV benefit with fewer side effects.  He is willing to consistently take 3x a week and will monitor if and when myalgias return.  Recommended not stopping for long periods of time given his hx of htn and hyperlipidemia.     Lab Results  Component Value Date   CHOL 226 (H) 08/20/2018   CHOL 258 (H) 05/15/2017   CHOL 176 10/29/2015   Lab Results  Component Value Date   HDL 67 08/20/2018   HDL 68 05/15/2017   HDL 77 10/29/2015   Lab Results  Component Value Date   LDLCALC 115 (H) 08/20/2018   LDLCALC 137 (H) 05/15/2017   LDLCALC 74 10/29/2015   Lab Results  Component Value Date   TRIG 220 (H) 08/20/2018   TRIG 265 (H) 05/15/2017   TRIG 127 10/29/2015  Lab Results  Component Value Date   CHOLHDL 3.4 08/20/2018   CHOLHDL 3.8 05/15/2017   CHOLHDL 2.3 10/29/2015    Abnormal TSH: Hx of abnormal TSH with normal T3/T4 x 1 year ago.  Not treated with medication.  No symptoms of heat or cold intolerance.  No weight gain, hair loss, or excessive fatigue.    Left Shoulder Pain:   Intermittent, extending from his left shoulder into his left elbow.  Sharp, numb, and tingling.  Not associated with any known activity.  He has stopped taking his Benicar because he felt like his shoulder pain was associated with the BP medication.  Shoulder pain is not associated with jaw pain or chest pain.  No  SOB, palpitations, or DOE.  BP is elevated.  Unsure of average BP when taking the medication. He will take Ibuprofen/Tylenol for the shoulder pain when present but does not get any relief. He is fairly active at work.  No shoulder injury or repetitive movement.  No loss of range of motion.  Shoulder pain is worse when he lies down if it is present.  Does not wake up with numbness or tingling.    Insomnia:   Has been on Ambien in the past then changed to Trazodone.  Reports he will take 3 Trazodone qhs and not be able to fall asleep. He thinks Ambien works better for him.  He cannot fall asleep with c-pap on with insomnia but can tolerate CPAP if asleep.  Recently got a new one.  Generally will try to go to bed around 10, wake up around 5/5:30.  No evening, nighttime caffeine  Lab Results  Component Value Date   TSH 5.360 (H) 08/20/2018   Previous dx of Vitamin D deficiency with no recent value.  He walks about 3-4 miles a day on his job in maintenance for [employer].  No supplemental Vitamin D.  No chronic use of steroids or hx of smoking.  Will recheck today.    Barrett's Esophagus/hx Colon Cancer in mother, decreased from in 2006.  Hx of ulcerative colitis, well controlled.  Denies indigestion, GERD, Etoh or smoking.    Past Medical History:  Diagnosis Date  . Allergy   . Barrett's esophagus   . Essential hypertension 08/27/2019  . GERD (gastroesophageal reflux disease)    occ and uses nexium for this prn only  . Hx of adenomatous colonic polyps 01/13/2016  . Hyperlipidemia   . Low bone density 03/20/2016  . Shoulder pain, left 08/27/2019  . Ulcerative colitis ([place])   . Vitamin D deficiency 03/10/2016    Current Outpatient Medications  Medication Sig Dispense Refill  . atorvastatin (LIPITOR) 20 MG tablet Take 1 tablet (20 mg total) by mouth daily. 90 tablet 3  . olmesartan (BENICAR) 20 MG tablet Take 1 tablet (20 mg total) by mouth daily. Schedule Office Visit 30 tablet 0  .  traZODone (DESYREL) 50 MG tablet Take 2 tablets (100 mg total) by mouth at bedtime as needed for sleep. 180 tablet 1  . zolpidem (AMBIEN) 10 MG tablet Take 1 tablet (10 mg total) by mouth at bedtime as needed for 7 days for sleep, THEN 0.5 tablets (5 mg total) at bedtime as needed for up to 7 days for sleep. 11 tablet 0  . zolpidem (AMBIEN) 10 MG tablet Take 1 tablet (10 mg total) by mouth at bedtime as needed for sleep. 30 tablet 1   No current facility-administered medications for this visit.  Allergies: No Known Allergies  Past Surgical History:  Procedure Laterality Date  . CHOLECYSTECTOMY N/A   . COLONOSCOPY    . TONSILLECTOMY    . UPPER GASTROINTESTINAL ENDOSCOPY      Social History   Socioeconomic History  . Marital status: Single    Spouse name: Not on file  . Number of children: 0  . Years of education: Not on file  . Highest education level: Not on file  Occupational History  . Occupation: Multimedia programmer: [employer]  . Financial resource strain: Not on file  . Food insecurity    Worry: Not on file    Inability: Not on file  . Transportation needs    Medical: Not on file    Non-medical: Not on file  Tobacco Use  . Smoking status: Former Smoker    Packs/day: 0.50    Years: 10.00    Pack years: 5.00    Quit date: 11/14/1994    Years since quitting: 24.8  . Smokeless tobacco: Never Used  Substance and Sexual Activity  . Alcohol use: Yes    Alcohol/week: 18.0 standard drinks    Types: 18 Standard drinks or equivalent per week    Comment: up to 13 beers a week - 3-4 a night on nights that he drinks  . Drug use: No  . Sexual activity: Yes    Partners: Female  Lifestyle  . Physical activity    Days per week: Not on file    Minutes per session: Not on file  . Stress: Not on file  Relationships  . Social Herbalist on phone: Not on file    Gets together: Not on file    Attends religious service: Not on file    Active member  of club or organization: Not on file    Attends meetings of clubs or organizations: Not on file    Relationship status: Not on file  Other Topics Concern  . Not on file  Social History Narrative   Single - self-employed HVAC business-working [place]   4 EtOH/day   1 caffeine/day   High school-grade level   Exercise--no    Family History  Problem Relation Age of Onset  . Colon cancer Mother   . Breast cancer Sister   . Hyperlipidemia Sister   . Colon polyps Neg Hx   . Esophageal cancer Neg Hx   . Stomach cancer Neg Hx   . Rectal cancer Neg Hx      ROS Review of Systems  Constitutional: Negative for activity change, appetite change, chills and fever.  HENT: Negative for congestion, nosebleeds, trouble swallowing and voice change.   Respiratory: Negative for cough, shortness of breath and wheezing.   Gastrointestinal: Negative for diarrhea, nausea and vomiting.  Genitourinary: Negative for difficulty urinating, dysuria, flank pain and hematuria.  Musculoskeletal: Negative for back pain, joint swelling and neck pain.  Neurological: Negative for dizziness, speech difficulty, light-headedness and numbness.  See HPI. All other review of systems negative.     Objective: Vitals:   08/27/19 1052 08/27/19 1059  BP: (!) 173/90 (!) 163/89  Pulse: 76   Temp: 98.6 F (37 C)   SpO2: 99%   Weight: 189 lb (85.7 kg)   Height: 5' 7"  (1.702 m)     Physical Exam   Physical Exam  Constitutional: Oriented to person, place, and time. Appears well-developed and well-nourished.  HENT: Neck is supple.  Thyroid  without enlargement.  No carotid bruit.  Head: Normocephalic and atraumatic.  Eyes: Conjunctivae and EOM are normal.  Cardiovascular: Normal rate, regular rhythm, normal heart sounds and intact distal pulses.  No murmur heard. Pulmonary/Chest: Effort normal and breath sounds normal. No stridor. No respiratory distress. Has no wheezes.  Neurological: Is alert and  oriented to person, place, and time.  Musculoskeletal:  ROM to left shoulder full with crepitus noted on exam.  No pain with palpation or movement.  Strength 5/5 in left upper extremity:  Tricep/bicep/deltoid/grip/intrinsics. Limited ROM in neck with right rotation.  Reproduction of shoulder pain with active resistance on deltoid/triceps exam.  No erythema or warmth.  Skin: Skin is warm. Capillary refill takes less than 2 seconds.  Psychiatric: Has a normal mood and affect. Behavior is normal. Judgment and thought content normal.    Assessment and Plan  1.  Annual Exam:  Health Maintenance --Pt. To schedule Colonoscopy/Endoscopy.  EKG completed today.  Normal.    2.  Abnormal TSH:      -repeat thyroid panel and TSH  3.  Shoulder Pain:    -recommended stretching and moving shoulder in full ROM daily.  Will consider steroid dose pack, refer to PT, or to ortho if symptoms persist or worsen.   4.  Vitamin D Deficiency:       -recheck Vitamin D today. If low, may consider Dexa given past hx of low bone density.   5.  Insomnia:  -----Ambien prescription sent to pharmacy per patient request.  Educated on risks and to avoid taking nightly.  No additional sleep medication to be taken with Ambien.    6.  Hypertension.  Restart Benicar.  Do not stop medication.  Monitor home BP.   7.  Hyperlipidemia: Lipitor 56m.  Take 3x a week:  M,W,F.  Will adjust if myalgias return.  Recommended not to stop medication.  He will try alternate dosing of 3x a week.  Lipid profile today.  SGlyn Ade A total of  40 minutes were spent face-to-face with the patient during this encounter and over half of that time was spent on counseling and coordination of care.

## 2019-08-28 LAB — CMP14+EGFR
ALT: 24 IU/L (ref 0–44)
AST: 21 IU/L (ref 0–40)
Albumin/Globulin Ratio: 1.8 (ref 1.2–2.2)
Albumin: 4.2 g/dL (ref 3.8–4.8)
Alkaline Phosphatase: 62 IU/L (ref 39–117)
BUN/Creatinine Ratio: 13 (ref 10–24)
BUN: 12 mg/dL (ref 8–27)
Bilirubin Total: 0.4 mg/dL (ref 0.0–1.2)
CO2: 22 mmol/L (ref 20–29)
Calcium: 9.5 mg/dL (ref 8.6–10.2)
Chloride: 106 mmol/L (ref 96–106)
Creatinine, Ser: 0.9 mg/dL (ref 0.76–1.27)
GFR calc Af Amer: 105 mL/min/{1.73_m2} (ref >59)
GFR calc non Af Amer: 91 mL/min/{1.73_m2} (ref >59)
Globulin, Total: 2.3 g/dL (ref 1.5–4.5)
Glucose: 91 mg/dL (ref 65–99)
Potassium: 3.8 mmol/L (ref 3.5–5.2)
Sodium: 143 mmol/L (ref 134–144)
Total Protein: 6.5 g/dL (ref 6.0–8.5)

## 2019-08-28 LAB — LIPID PANEL
Chol/HDL Ratio: 3.9 ratio (ref 0.0–5.0)
Cholesterol, Total: 227 mg/dL — ABNORMAL HIGH (ref 100–199)
HDL: 58 mg/dL (ref >39)
LDL Chol Calc (NIH): 134 mg/dL — ABNORMAL HIGH (ref 0–99)
Triglycerides: 197 mg/dL — ABNORMAL HIGH (ref 0–149)
VLDL Cholesterol Cal: 35 mg/dL (ref 5–40)

## 2019-08-28 LAB — THYROID PANEL WITH TSH
Free Thyroxine Index: 1.5 (ref 1.2–4.9)
T3 Uptake Ratio: 26 % (ref 24–39)
T4, Total: 5.7 ug/dL (ref 4.5–12.0)
TSH: 4.84 u[IU]/mL — ABNORMAL HIGH (ref 0.450–4.500)

## 2019-08-28 LAB — VITAMIN D 25 HYDROXY (VIT D DEFICIENCY, FRACTURES): Vit D, 25-Hydroxy: 22.1 ng/mL — ABNORMAL LOW (ref 30.0–100.0)

## 2019-09-04 ENCOUNTER — Other Ambulatory Visit: Payer: Self-pay | Admitting: Adult Health Nurse Practitioner

## 2019-09-04 MED ORDER — LEVOTHYROXINE SODIUM 50 MCG PO TABS: 50.0000 ug | ORAL_TABLET | Freq: Every day | ORAL | 3 refills | Status: DC

## 2019-09-04 MED ORDER — VITAMIN D (ERGOCALCIFEROL) 1.25 MG (50000 UNIT) PO CAPS: 50000.0000 [IU] | ORAL_CAPSULE | ORAL | 3 refills | Status: AC

## 2019-09-04 NOTE — Progress Notes (Signed)
Hi Dennis Orr,  Your labs had a few abnormalities.  Your triglycerides and bad cholesterol were abnormal.  Hopefully you are doing okay on the 3 day a week Lipitor for now.   In addition, you have had high thyroid function.  This is the second value abnormal for you.  I would recommend treating it as it could have a beneficial effect on your cholesterol and energy levels.  Vitamin D also remained low.  I have sent in a once weekly Vitamin D to take for 2 months then recheck.   I have sent two medications into your pharmacy.  If you could follow up with me sooner than we had planned, before Thanksgiving or Christmas, we can determine if the new medications are improving your labs.  Feel free to call if you have any questions or concerns.

## 2019-10-30 ENCOUNTER — Other Ambulatory Visit: Payer: Self-pay | Admitting: Adult Health Nurse Practitioner

## 2019-10-30 DIAGNOSIS — I10 Essential (primary) hypertension: Secondary | ICD-10-CM

## 2019-10-30 MED ORDER — OLMESARTAN MEDOXOMIL 20 MG PO TABS: 20.0000 mg | ORAL_TABLET | Freq: Every day | ORAL | 0 refills | Status: DC

## 2019-10-30 NOTE — Telephone Encounter (Signed)
Copied from [place] [id number]. Topic: Quick Communication - Rx Refill/Question >> Oct 30, 2019 11:33 AM Yvette Rack wrote: Medication: olmesartan (BENICAR) 20 MG tablet  Has the patient contacted their pharmacy? yes   Preferred Pharmacy (with phone number or street name): [id number] [place] [place]  Phone: [phone] Fax: [phone] Agent: Please be advised that RX refills may take up to 3 business days. We ask that you follow-up with your pharmacy.

## 2019-11-27 ENCOUNTER — Other Ambulatory Visit: Payer: Self-pay

## 2019-11-27 ENCOUNTER — Telehealth (INDEPENDENT_AMBULATORY_CARE_PROVIDER_SITE_OTHER): Payer: BC Managed Care – PPO | Admitting: Adult Health Nurse Practitioner

## 2019-11-27 DIAGNOSIS — G4700 Insomnia, unspecified: Secondary | ICD-10-CM

## 2019-11-27 DIAGNOSIS — E559 Vitamin D deficiency, unspecified: Secondary | ICD-10-CM

## 2019-11-27 DIAGNOSIS — R7989 Other specified abnormal findings of blood chemistry: Secondary | ICD-10-CM | POA: Diagnosis not present

## 2019-11-27 DIAGNOSIS — I10 Essential (primary) hypertension: Secondary | ICD-10-CM

## 2019-11-27 DIAGNOSIS — E7800 Pure hypercholesterolemia, unspecified: Secondary | ICD-10-CM | POA: Diagnosis not present

## 2019-11-27 MED ORDER — ZOLPIDEM TARTRATE 10 MG PO TABS: 10.0000 mg | ORAL_TABLET | Freq: Every evening | ORAL | 1 refills | Status: DC | PRN

## 2019-11-27 MED ORDER — OLMESARTAN MEDOXOMIL 20 MG PO TABS: 20.0000 mg | ORAL_TABLET | Freq: Every day | ORAL | 2 refills | Status: DC

## 2019-11-27 NOTE — Patient Instructions (Signed)
° ° ° °  If you have lab work done today you will be contacted with your lab results within the next 2 weeks.  If you have not heard from us then please contact us. The fastest way to get your results is to register for My Chart. ° ° °IF you received an x-ray today, you will receive an invoice from [facility]. Please contact [facility] at [phone] with questions or concerns regarding your invoice.  ° °IF you received labwork today, you will receive an invoice from [facility]. Please contact [facility] at [phone] with questions or concerns regarding your invoice.  ° °Our billing staff will not be able to assist you with questions regarding bills from these companies. ° °You will be contacted with the lab results as soon as they are available. The fastest way to get your results is to activate your My Chart account. Instructions are located on the last page of this paperwork. If you have not heard from us regarding the results in 2 weeks, please contact this office. °  ° ° ° °

## 2019-11-28 NOTE — Progress Notes (Signed)
Telemedicine Encounter- SOAP NOTE Established Patient  This telephone encounter was conducted with the patient's (or proxy's) verbal consent via audio telecommunications: yes/no: Yes Patient was instructed to have this encounter in a suitably private space; and to only have persons present to whom they give permission to participate. In addition, patient identity was confirmed by use of name plus two identifiers (DOB and address).  I discussed the limitations, risks, security and privacy concerns of performing an evaluation and management service by telephone and the availability of in person appointments. I also discussed with the patient that there may be a patient responsible charge related to this service. The patient expressed understanding and agreed to proceed.  I spent a total of TIME; 0 MIN TO 60 MIN: 15 minutes talking with the patient or their proxy.  Chief Complaint  Patient presents with  . Follow-up    Medications    Subjective   Dennis Orr is a 64 y.o. established patient. Telephone visit today for f/u chronic medical problems.   HPI  Patient is following up on his hypertension, vitamin D deficiency, elevated TSH, and he reports that his blood pressure has been running good and stable.  He is not experiencing any cardiac symptoms.  He has been taking his vitamin D regularly and has about 3 or 4 left.  He has restarted the cholesterol medication and taking it on Monday Wednesday Friday seems to have allowed him to take it without the subsequent myalgias that he was experiencing previously.  Patient takes Ambien and has been taking it chronically prior to seeing me as a provider.  He requested a refill. Patient Active Problem List   Diagnosis Date Noted  . Elevated TSH 11/27/2019  . Shoulder pain, left 08/27/2019  . Essential hypertension 08/27/2019  . Insomnia 06/21/2017  . Elevated cholesterol 05/15/2017  . History of sleep apnea 05/15/2017  . Low bone density  03/20/2016  . Vitamin D deficiency 03/10/2016  . Hx of adenomatous colonic polyps 01/13/2016  . Barrett's esophagus - NOT - egd 12/2015 no changes 12/09/2015  . Ulcerative colitis ([place]) 07/29/2015    Past Medical History:  Diagnosis Date  . Allergy   . Barrett's esophagus   . Essential hypertension 08/27/2019  . GERD (gastroesophageal reflux disease)    occ and uses nexium for this prn only  . Hx of adenomatous colonic polyps 01/13/2016  . Hyperlipidemia   . Low bone density 03/20/2016  . Shoulder pain, left 08/27/2019  . Ulcerative colitis ([place])   . Vitamin D deficiency 03/10/2016    Current Outpatient Medications  Medication Sig Dispense Refill  . atorvastatin (LIPITOR) 20 MG tablet Take 1 tablet (20 mg total) by mouth daily. 90 tablet 3  . olmesartan (BENICAR) 20 MG tablet Take 1 tablet (20 mg total) by mouth daily. Schedule Office Visit 90 tablet 2  . zolpidem (AMBIEN) 10 MG tablet Take 1 tablet (10 mg total) by mouth at bedtime as needed for 7 days for sleep, THEN 0.5 tablets (5 mg total) at bedtime as needed for up to 7 days for sleep. 11 tablet 0  . zolpidem (AMBIEN) 10 MG tablet Take 1 tablet (10 mg total) by mouth at bedtime as needed for sleep. 90 tablet 1  . levothyroxine (SYNTHROID) 50 MCG tablet Take 1 tablet (50 mcg total) by mouth daily. (Patient not taking: Reported on 11/27/2019) 90 tablet 3   No current facility-administered medications for this visit.    No Known Allergies  Social  History   Socioeconomic History  . Marital status: Single    Spouse name: Not on file  . Number of children: 0  . Years of education: Not on file  . Highest education level: Not on file  Occupational History  . Occupation: Multimedia programmer: [employer]  Tobacco Use  . Smoking status: Former Smoker    Packs/day: 0.50    Years: 10.00    Pack years: 5.00    Quit date: 11/14/1994    Years since quitting: 25.0  . Smokeless tobacco: Never Used  Substance and Sexual Activity  .  Alcohol use: Yes    Alcohol/week: 18.0 standard drinks    Types: 18 Standard drinks or equivalent per week    Comment: up to 13 beers a week - 3-4 a night on nights that he drinks  . Drug use: No  . Sexual activity: Yes    Partners: Female  Other Topics Concern  . 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 Difficulty of Paying Living Expenses: Not on file  Food Insecurity:   . Worried About Charity fundraiser in the Last Year: Not on file  . Ran Out of Food in the Last Year: Not on file  Transportation Needs:   . Lack of Transportation (Medical): Not on file  . Lack of Transportation (Non-Medical): Not on file  Physical Activity:   . Days of Exercise per Week: Not on file  . Minutes of Exercise per Session: Not on file  Stress: No Stress Concern Present  . Feeling of Stress : Only a little  Social Connections:   . Frequency of Communication with Friends and Family: Not on file  . Frequency of Social Gatherings with Friends and Family: Not on file  . Attends Religious Services: Not on file  . Active Member of Clubs or Organizations: Not on file  . Attends Archivist Meetings: Not on file  . Marital Status: Not on file  Intimate Partner Violence:   . Fear of Current or Ex-Partner: Not on file  . Emotionally Abused: Not on file  . Physically Abused: Not on file  . Sexually Abused: Not on file    ROS   Review of Systems See HPI Constitution: No fevers or chills No malaise No diaphoresis Skin: No rash or itching Eyes: no blurry vision, no double vision GU: no dysuria or hematuria Neuro: no dizziness or headaches   Objective    General appearance: oriented to person, place, and time. Mental Status: normal mood, behavior, speech, dress, motor activity,  and thought processes.   Vitals as reported by the patient: There were no vitals filed for this visit.  Dennis Orr was seen today for follow-up.  Diagnoses and all orders for this visit:  Essential hypertension -     CMP14+EGFR; Future -     Lipid panel; Future -     VITAMIN D 25 Hydroxy (Vit-D Deficiency, Fractures); Future -     Thyroid Panel With TSH; Future -     olmesartan (BENICAR) 20 MG tablet; Take 1 tablet (20 mg total) by mouth daily. Schedule Office Visit  Vitamin D deficiency -     CMP14+EGFR; Future -     Lipid panel; Future -  VITAMIN D 25 Hydroxy (Vit-D Deficiency, Fractures); Future -     Thyroid Panel With TSH; Future  Elevated TSH -     CMP14+EGFR; Future -     Lipid panel; Future -     VITAMIN D 25 Hydroxy (Vit-D Deficiency, Fractures); Future -     Thyroid Panel With TSH; Future  Elevated cholesterol -     CMP14+EGFR; Future -     Lipid panel; Future -     VITAMIN D 25 Hydroxy (Vit-D Deficiency, Fractures); Future -     Thyroid Panel With TSH; Future  Insomnia, unspecified type  Other orders -     zolpidem (AMBIEN) 10 MG tablet; Take 1 tablet (10 mg total) by mouth at bedtime as needed for sleep.  Reviewed the patient's problems.  I still would like if he was on a different sleep medication but this has worked for him for some years I will refill.  We will recheck fasting labs.  I have placed future orders and ask him to come to a nurse visit in between now and February 14.  Following that we will schedule a 68-monthfollow-up appointment.  He is in line with this plan.   I discussed the assessment and treatment plan with the patient. The patient was provided an opportunity to ask questions and all were answered. The patient agreed with the plan and demonstrated an understanding of the instructions.   The patient was advised to call back or seek an in-person evaluation if the symptoms worsen or if the condition fails to improve as anticipated.  I  provided 15 minutes of non-face-to-face time during this encounter.  SGlyn Ade NP  [facility]

## 2019-12-27 ENCOUNTER — Ambulatory Visit: Payer: BC Managed Care – PPO

## 2019-12-27 ENCOUNTER — Other Ambulatory Visit: Payer: Self-pay

## 2019-12-27 DIAGNOSIS — E559 Vitamin D deficiency, unspecified: Secondary | ICD-10-CM

## 2019-12-27 DIAGNOSIS — R7989 Other specified abnormal findings of blood chemistry: Secondary | ICD-10-CM

## 2019-12-27 DIAGNOSIS — I10 Essential (primary) hypertension: Secondary | ICD-10-CM

## 2019-12-27 DIAGNOSIS — E7800 Pure hypercholesterolemia, unspecified: Secondary | ICD-10-CM

## 2019-12-28 LAB — CMP14+EGFR
ALT: 23 IU/L (ref 0–44)
AST: 22 IU/L (ref 0–40)
Albumin/Globulin Ratio: 2 (ref 1.2–2.2)
Albumin: 4.5 g/dL (ref 3.8–4.8)
Alkaline Phosphatase: 72 IU/L (ref 39–117)
BUN/Creatinine Ratio: 9 — ABNORMAL LOW (ref 10–24)
BUN: 8 mg/dL (ref 8–27)
Bilirubin Total: 0.5 mg/dL (ref 0.0–1.2)
CO2: 23 mmol/L (ref 20–29)
Calcium: 9.4 mg/dL (ref 8.6–10.2)
Chloride: 102 mmol/L (ref 96–106)
Creatinine, Ser: 0.85 mg/dL (ref 0.76–1.27)
GFR calc Af Amer: 108 mL/min/{1.73_m2} (ref >59)
GFR calc non Af Amer: 93 mL/min/{1.73_m2} (ref >59)
Globulin, Total: 2.2 g/dL (ref 1.5–4.5)
Glucose: 85 mg/dL (ref 65–99)
Potassium: 4.5 mmol/L (ref 3.5–5.2)
Sodium: 139 mmol/L (ref 134–144)
Total Protein: 6.7 g/dL (ref 6.0–8.5)

## 2019-12-28 LAB — LIPID PANEL
Chol/HDL Ratio: 2.6 ratio (ref 0.0–5.0)
Cholesterol, Total: 179 mg/dL (ref 100–199)
HDL: 68 mg/dL (ref >39)
LDL Chol Calc (NIH): 88 mg/dL (ref 0–99)
Triglycerides: 131 mg/dL (ref 0–149)
VLDL Cholesterol Cal: 23 mg/dL (ref 5–40)

## 2019-12-28 LAB — THYROID PANEL WITH TSH
Free Thyroxine Index: 1.5 (ref 1.2–4.9)
T3 Uptake Ratio: 27 % (ref 24–39)
T4, Total: 5.7 ug/dL (ref 4.5–12.0)
TSH: 5.88 u[IU]/mL — ABNORMAL HIGH (ref 0.450–4.500)

## 2019-12-28 LAB — VITAMIN D 25 HYDROXY (VIT D DEFICIENCY, FRACTURES): Vit D, 25-Hydroxy: 30.2 ng/mL (ref 30.0–100.0)

## 2020-01-11 ENCOUNTER — Ambulatory Visit: Payer: BLUE CROSS/BLUE SHIELD

## 2020-01-21 ENCOUNTER — Other Ambulatory Visit: Payer: Self-pay | Admitting: Adult Health Nurse Practitioner

## 2020-01-21 MED ORDER — LEVOTHYROXINE SODIUM 75 MCG PO TABS: 75.0000 ug | ORAL_TABLET | Freq: Every day | ORAL | 1 refills | Status: DC

## 2020-03-16 ENCOUNTER — Encounter: Payer: Self-pay | Admitting: Internal Medicine

## 2020-03-17 ENCOUNTER — Ambulatory Visit: Payer: BC Managed Care – PPO | Admitting: Adult Health Nurse Practitioner

## 2020-03-17 ENCOUNTER — Other Ambulatory Visit: Payer: Self-pay

## 2020-03-17 VITALS — BP 139/78 | HR 70 | Temp 97.8°F | Ht 67.0 in | Wt 189.2 lb

## 2020-03-17 DIAGNOSIS — E7800 Pure hypercholesterolemia, unspecified: Secondary | ICD-10-CM | POA: Diagnosis not present

## 2020-03-17 DIAGNOSIS — R7989 Other specified abnormal findings of blood chemistry: Secondary | ICD-10-CM | POA: Diagnosis not present

## 2020-03-17 DIAGNOSIS — E559 Vitamin D deficiency, unspecified: Secondary | ICD-10-CM

## 2020-03-17 DIAGNOSIS — I10 Essential (primary) hypertension: Secondary | ICD-10-CM | POA: Diagnosis not present

## 2020-03-17 NOTE — Patient Instructions (Signed)
° ° ° °  If you have lab work done today you will be contacted with your lab results within the next 2 weeks.  If you have not heard from us then please contact us. The fastest way to get your results is to register for My Chart. ° ° °IF you received an x-ray today, you will receive an invoice from [facility]. Please contact [facility] at [phone] with questions or concerns regarding your invoice.  ° °IF you received labwork today, you will receive an invoice from [facility]. Please contact [facility] at [phone] with questions or concerns regarding your invoice.  ° °Our billing staff will not be able to assist you with questions regarding bills from these companies. ° °You will be contacted with the lab results as soon as they are available. The fastest way to get your results is to activate your My Chart account. Instructions are located on the last page of this paperwork. If you have not heard from us regarding the results in 2 weeks, please contact this office. °  ° ° ° °

## 2020-03-18 LAB — CMP14+EGFR
ALT: 16 IU/L (ref 0–44)
AST: 15 IU/L (ref 0–40)
Albumin/Globulin Ratio: 1.8 (ref 1.2–2.2)
Albumin: 4.6 g/dL (ref 3.8–4.8)
Alkaline Phosphatase: 83 IU/L (ref 39–117)
BUN/Creatinine Ratio: 10 (ref 10–24)
BUN: 9 mg/dL (ref 8–27)
Bilirubin Total: 0.4 mg/dL (ref 0.0–1.2)
CO2: 22 mmol/L (ref 20–29)
Calcium: 9.9 mg/dL (ref 8.6–10.2)
Chloride: 104 mmol/L (ref 96–106)
Creatinine, Ser: 0.91 mg/dL (ref 0.76–1.27)
GFR calc Af Amer: 104 mL/min/{1.73_m2} (ref >59)
GFR calc non Af Amer: 90 mL/min/{1.73_m2} (ref >59)
Globulin, Total: 2.6 g/dL (ref 1.5–4.5)
Glucose: 97 mg/dL (ref 65–99)
Potassium: 4.4 mmol/L (ref 3.5–5.2)
Sodium: 140 mmol/L (ref 134–144)
Total Protein: 7.2 g/dL (ref 6.0–8.5)

## 2020-03-18 LAB — PSA: Prostate Specific Ag, Serum: 3 ng/mL (ref 0.0–4.0)

## 2020-03-18 LAB — THYROID PANEL WITH TSH
Free Thyroxine Index: 1.4 (ref 1.2–4.9)
T3 Uptake Ratio: 24 % (ref 24–39)
T4, Total: 5.9 ug/dL (ref 4.5–12.0)
TSH: 5.01 u[IU]/mL — ABNORMAL HIGH (ref 0.450–4.500)

## 2020-03-18 LAB — VITAMIN D 25 HYDROXY (VIT D DEFICIENCY, FRACTURES): Vit D, 25-Hydroxy: 24.8 ng/mL — ABNORMAL LOW (ref 30.0–100.0)

## 2020-03-18 LAB — MICROALBUMIN, URINE: Microalbumin, Urine: 8.1 ug/mL

## 2020-03-25 ENCOUNTER — Other Ambulatory Visit: Payer: Self-pay | Admitting: Adult Health Nurse Practitioner

## 2020-03-25 ENCOUNTER — Encounter: Payer: Self-pay | Admitting: Adult Health Nurse Practitioner

## 2020-03-25 MED ORDER — VITAMIN D (ERGOCALCIFEROL) 1.25 MG (50000 UNIT) PO CAPS: 50000.0000 [IU] | ORAL_CAPSULE | ORAL | 3 refills | Status: AC

## 2020-03-25 MED ORDER — LEVOTHYROXINE SODIUM 75 MCG PO TABS: 75.0000 ug | ORAL_TABLET | Freq: Every day | ORAL | 3 refills | Status: DC

## 2020-03-25 NOTE — Progress Notes (Signed)
Chief Complaint  Patient presents with  . Follow-up    on medical conditions    HPI   Patient is here today to follow up on his chronic medical problems. He feels okay on current dose of thyroid medication.  Denies any symptoms of hypothyroid such as depression, weight gain, fatigue, and/or hair loss.   Here today for refills and repeat blood work.  No other problems at this time.   Problem List    Problem List: 2021-01: Elevated TSH 2020-10: Shoulder pain, left 2020-10: Essential hypertension 2018-08: Insomnia 2018-07: Elevated cholesterol 2018-07: History of sleep apnea 2017-05: Low bone density 2017-04: Vitamin D deficiency 2017-03: Hx of adenomatous colonic polyps 2017-01: Barrett's esophagus - NOT - egd 12/2015 no changes 2016-09: Ulcerative colitis ([place])   Allergies   has No Known Allergies.  Medications    Current Outpatient Medications:  .  atorvastatin (LIPITOR) 20 MG tablet, Take 1 tablet (20 mg total) by mouth daily., Disp: 90 tablet, Rfl: 3 .  levothyroxine (SYNTHROID) 50 MCG tablet, Take 1 tablet (50 mcg total) by mouth daily., Disp: 90 tablet, Rfl: 3 .  olmesartan (BENICAR) 20 MG tablet, Take 1 tablet (20 mg total) by mouth daily. Schedule Office Visit, Disp: 90 tablet, Rfl: 2 .  levothyroxine (SYNTHROID) 75 MCG tablet, Take 1 tablet (75 mcg total) by mouth daily., Disp: 30 tablet, Rfl: 3 .  Vitamin D, Ergocalciferol, (DRISDOL) 1.25 MG (50000 UNIT) CAPS capsule, Take 1 capsule (50,000 Units total) by mouth every 7 (seven) days., Disp: 5 capsule, Rfl: 3 .  zolpidem (AMBIEN) 10 MG tablet, Take 1 tablet (10 mg total) by mouth at bedtime as needed for 7 days for sleep, THEN 0.5 tablets (5 mg total) at bedtime as needed for up to 7 days for sleep., Disp: 11 tablet, Rfl: 0 .  zolpidem (AMBIEN) 10 MG tablet, Take 1 tablet (10 mg total) by mouth at bedtime as needed for sleep., Disp: 90 tablet, Rfl: 1   Review of Systems    Constitutional: Negative for activity  change, appetite change, chills and fever.  HENT: Negative for congestion, nosebleeds, trouble swallowing and voice change.   Respiratory: Negative for cough, shortness of breath and wheezing.   Cardiac:  Negative for chest pain, pressure, syncope  Gastrointestinal: Negative for diarrhea, nausea and vomiting.  Genitourinary: Negative for difficulty urinating, dysuria, flank pain and hematuria.  Musculoskeletal: Negative for back pain, joint swelling and neck pain.  Neurological: Negative for dizziness, speech difficulty, light-headedness and numbness.  See HPI. All other review of systems negative.     Physical Exam:    height is 5' 7"  (1.702 m) and weight is 189 lb 3.2 oz (85.8 kg). His temporal temperature is 97.8 F (36.6 C). His blood pressure is 139/78 and his pulse is 70. His oxygen saturation is 98%.   Physical Examination: General appearance - alert, well appearing, and in no distress and oriented to person, place, and time Mental status - normal mood, behavior, speech, dress, motor activity, and thought processes Eyes - PERRL. Extraocular movements intact.  No nystagmus.  Neck - supple, no significant adenopathy, carotids upstroke normal bilaterally, no bruits, thyroid exam: thyroid is normal in size without nodules or tenderness Chest - clear to auscultation, no wheezes, rales or rhonchi, symmetric air entry  Heart - normal rate, regular rhythm, normal S1, S2, no murmurs, rubs, clicks or gallops Extremities - dependent LE edema without clubbing or cyanosis Skin - normal coloration and turgor, no rashes, no suspicious skin  lesions noted  No hyperpigmentation of skin.  No current hematomas noted   Lab /Imaging Review    orders written for new lab studies as appropriate; see orders.   Assessment & Plan:  Mehdi Gironda is a 64 y.o. male    1. Essential hypertension   2. Vitamin D deficiency   3. Elevated TSH   4. Elevated cholesterol    Orders Placed This Encounter    Procedures  . PSA  . CMP14+EGFR  . Microalbumin, urine  . VITAMIN D 25 Hydroxy (Vit-D Deficiency, Fractures)  . Thyroid Panel With TSH  . POCT urinalysis dipstick   No orders of the defined types were placed in this encounter.  F/u pending blood work.  He is inline with this plan.   Glyn Ade, NP

## 2020-05-07 ENCOUNTER — Encounter: Payer: Self-pay | Admitting: Internal Medicine

## 2020-05-07 ENCOUNTER — Ambulatory Visit: Payer: BC Managed Care – PPO | Admitting: Internal Medicine

## 2020-05-07 VITALS — BP 126/70 | HR 76 | Ht 67.0 in | Wt 191.0 lb

## 2020-05-07 DIAGNOSIS — Z86010 Personal history of colonic polyps: Secondary | ICD-10-CM

## 2020-05-07 DIAGNOSIS — R1310 Dysphagia, unspecified: Secondary | ICD-10-CM

## 2020-05-07 DIAGNOSIS — K529 Noninfective gastroenteritis and colitis, unspecified: Secondary | ICD-10-CM

## 2020-05-07 DIAGNOSIS — R1319 Other dysphagia: Secondary | ICD-10-CM

## 2020-05-07 NOTE — Progress Notes (Signed)
   Dennis Orr 64 y.o. 05/02/1956 [id number]  Assessment & Plan:   Encounter Diagnoses  Name Primary?  . Inflammatory bowel disease - right colitis Yes  . Esophageal dysphagia   . Hx of adenomatous colonic polyps     EGD to eval and treat dysphagia  Colonoscopy f/u colitis and polyps  Further plans pending that - will need thyroid f/u w/ PCP - most recent one may have left so will need to find another  The risks and benefits as well as alternatives of endoscopic procedure(s) have been discussed and reviewed. All questions answered. The patient agrees to proceed.   I appreciate the opportunity to care for. Gatha Mayer, MD, [facility]     Subjective:   Chief Complaint: hx colitis, polyps and dysphagia  HPI 64 yo wm w/ hx IBD - right sided colitis and hx 4 adenomas - last colonoscopy 2017. Was placed on mesalamine - was having arthralgias and we were titrating meds but has not followed up since 2017. Stools tend to be soft but not watery and no rectal bleeding.  Solid food dysphagia at least several times a month now. Some heartburn. Not on therapy.  Was on thyroid replacement treatment recentty but has stopped No Known Allergies Current Meds  Medication Sig  . atorvastatin (LIPITOR) 20 MG tablet Take 1 tablet (20 mg total) by mouth daily.  Marland Kitchen olmesartan (BENICAR) 20 MG tablet Take 1 tablet (20 mg total) by mouth daily. Schedule Office Visit   Past Medical History:  Diagnosis Date  . Allergy   . Barrett's esophagus   . Essential hypertension 08/27/2019  . GERD (gastroesophageal reflux disease)    occ and uses nexium for this prn only  . Hx of adenomatous colonic polyps 01/13/2016  . Hyperlipidemia   . Low bone density 03/20/2016  . Shoulder pain, left 08/27/2019  . Ulcerative colitis ([place])   . Vitamin D deficiency 03/10/2016   Past Surgical History:  Procedure Laterality Date  . CHOLECYSTECTOMY N/A   . COLONOSCOPY    . TONSILLECTOMY    . UPPER GASTROINTESTINAL  ENDOSCOPY     Social History   Social History Narrative   Single - self-employed [employer]   4 EtOH/day   1 caffeine/day   High school-grade level   Exercise--no   family history includes Breast cancer in his sister; Colon cancer in his mother; Hyperlipidemia in his sister.   Review of Systems As above Insomnia Otherwise negative  Objective:   Physical Exam BP 126/70   Pulse 76   Ht 5' 7"  (1.702 m)   Wt 191 lb (86.6 kg)   BMI 29.91 kg/m  NAD Anicteric Lungs cta Cor NL abd soft nt w/o hsm/mass and BS + Alert and oriented x 3  Data reviewed 2017 GI notes, pathology and 2021 PCP notes, labs

## 2020-05-07 NOTE — Patient Instructions (Signed)
You have been scheduled for an endoscopy and colonoscopy. Please follow the written instructions given to you at your visit today. Please pick up your prep supplies at the pharmacy within the next 1-3 days. If you use inhalers (even only as needed), please bring them with you on the day of your procedure.   I appreciate the opportunity to care for you. Silvano Rusk, MD, [facility]

## 2020-05-08 ENCOUNTER — Encounter: Payer: Self-pay | Admitting: Internal Medicine

## 2020-05-20 ENCOUNTER — Ambulatory Visit (AMBULATORY_SURGERY_CENTER): Payer: BC Managed Care – PPO | Admitting: Internal Medicine

## 2020-05-20 ENCOUNTER — Encounter: Payer: Self-pay | Admitting: Internal Medicine

## 2020-05-20 ENCOUNTER — Other Ambulatory Visit: Payer: Self-pay

## 2020-05-20 VITALS — BP 115/57 | HR 63 | Temp 98.4°F | Resp 19 | Ht 67.0 in | Wt 191.0 lb

## 2020-05-20 DIAGNOSIS — K317 Polyp of stomach and duodenum: Secondary | ICD-10-CM

## 2020-05-20 DIAGNOSIS — K2950 Unspecified chronic gastritis without bleeding: Secondary | ICD-10-CM

## 2020-05-20 DIAGNOSIS — K2100 Gastro-esophageal reflux disease with esophagitis, without bleeding: Secondary | ICD-10-CM | POA: Diagnosis not present

## 2020-05-20 DIAGNOSIS — D125 Benign neoplasm of sigmoid colon: Secondary | ICD-10-CM | POA: Diagnosis not present

## 2020-05-20 DIAGNOSIS — K529 Noninfective gastroenteritis and colitis, unspecified: Secondary | ICD-10-CM

## 2020-05-20 DIAGNOSIS — R1310 Dysphagia, unspecified: Secondary | ICD-10-CM | POA: Diagnosis not present

## 2020-05-20 DIAGNOSIS — D124 Benign neoplasm of descending colon: Secondary | ICD-10-CM | POA: Diagnosis not present

## 2020-05-20 DIAGNOSIS — K222 Esophageal obstruction: Secondary | ICD-10-CM | POA: Diagnosis not present

## 2020-05-20 DIAGNOSIS — K2210 Ulcer of esophagus without bleeding: Secondary | ICD-10-CM | POA: Diagnosis not present

## 2020-05-20 DIAGNOSIS — R1319 Other dysphagia: Secondary | ICD-10-CM

## 2020-05-20 MED ORDER — ESOMEPRAZOLE MAGNESIUM 40 MG PO CPDR: 40.0000 mg | DELAYED_RELEASE_CAPSULE | Freq: Every day | ORAL | 3 refills | Status: DC

## 2020-05-20 MED ORDER — SODIUM CHLORIDE 0.9 % IV SOLN: 500.0000 mL | Freq: Once | INTRAVENOUS | Status: DC

## 2020-05-20 NOTE — Progress Notes (Signed)
Report given to PACU, vssReport given to PACU, vss

## 2020-05-20 NOTE — Patient Instructions (Addendum)
Please see the procedure reports  Esophagus inflamed - biopsied and stretched. Start esomeprazole to treat this. Stomach polyps - look benign - biopsied  Hiatal Hernia  Follow reflux diet.  Colon - 4 polyps removed, diverticulosis, no inflammation seen but biopsies taken.  Will contact with results and plans.  Please get follow-up for your thyroid problems as well - that is important. If you need a new primary care provider you could look at the [facility] sites and there are other [facility] sites. I can help if you want.  I appreciate the opportunity to care for you. Gatha Mayer, MD, FACG    YOU HAD AN ENDOSCOPIC PROCEDURE TODAY AT [facility]:   Refer to the procedure report that was given to you for any specific questions about what was found during the examination.  If the procedure report does not answer your questions, please call your gastroenterologist to clarify.  If you requested that your care partner not be given the details of your procedure findings, then the procedure report has been included in a sealed envelope for you to review at your convenience later.  YOU SHOULD EXPECT: Some feelings of bloating in the abdomen. Passage of more gas than usual.  Walking can help get rid of the air that was put into your GI tract during the procedure and reduce the bloating. If you had a lower endoscopy (such as a colonoscopy or flexible sigmoidoscopy) you may notice spotting of blood in your stool or on the toilet paper. If you underwent a bowel prep for your procedure, you may not have a normal bowel movement for a few days.  Please Note:  You might notice some irritation and congestion in your nose or some drainage.  This is from the oxygen used during your procedure.  There is no need for concern and it should clear up in a day or so.  SYMPTOMS TO REPORT IMMEDIATELY:   Following lower endoscopy (colonoscopy or flexible sigmoidoscopy):  Excessive  amounts of blood in the stool  Significant tenderness or worsening of abdominal pains  Swelling of the abdomen that is new, acute  Fever of 100F or higher   Following upper endoscopy (EGD)  Vomiting of blood or coffee ground material  New chest pain or pain under the shoulder blades  Painful or persistently difficult swallowing  New shortness of breath  Fever of 100F or higher  Black, tarry-looking stools  For urgent or emergent issues, a gastroenterologist can be reached at any hour by calling [phone]. Do not use MyChart messaging for urgent concerns.    DIET: FOLLOW DILATION HANDOUT.  ACTIVITY:  You should plan to take it easy for the rest of today and you should NOT DRIVE or use heavy machinery until tomorrow (because of the sedation medicines used during the test).    FOLLOW UP: Our staff will call the number listed on your records 48-72 hours following your procedure to check on you and address any questions or concerns that you may have regarding the information given to you following your procedure. If we do not reach you, we will leave a message.  We will attempt to reach you two times.  During this call, we will ask if you have developed any symptoms of COVID 19. If you develop any symptoms (ie: fever, flu-like symptoms, shortness of breath, cough etc.) before then, please call [phone].  If you test positive for Covid 19 in the 2 weeks post  procedure, please call and report this information to [place].    If any biopsies were taken you will be contacted by phone or by letter within the next 1-3 weeks.  Please call us at [phone] if you have not heard about the biopsies in 3 weeks.    SIGNATURES/CONFIDENTIALITY: You and/or your care partner have signed paperwork which will be entered into your electronic medical record.  These signatures attest to the fact that that the information above on your After Visit Summary has been reviewed and is understood.  Full  responsibility of the confidentiality of this discharge information lies with you and/or your care-partner.   RESUME MEDICATION. INFORMATION GIVEN ON ESOPHAGITIS, GERD/HIATAL HERNIA,POLYPS. PICK UP NEXIUM AND START TAKING IN THE MORNING 20-30 MINUTES BEFORE MEALS.

## 2020-05-20 NOTE — Progress Notes (Signed)
Robinul 0.1 mg IV given due large amount of secretions upon assessment.  MD made aware, vss

## 2020-05-20 NOTE — Op Note (Signed)
[facility] Patient Name: Dennis Orr Procedure Date: 05/20/2020 2:41 PM MRN: [id number] Endoscopist: Gatha Mayer , MD Age: 64 Referring MD:  Date of Birth: Sep 15, 1956 Gender: Male Account #: [id number] Procedure:                Colonoscopy Indications:              Surveillance: Personal history of adenomatous                            polyps on last colonoscopy > 3 years ago and right                            sided colitis/IBD Medicines:                Propofol per Anesthesia, Monitored Anesthesia Care Procedure:                Pre-Anesthesia Assessment:                           - Prior to the procedure, a History and Physical                            was performed, and patient medications and                            allergies were reviewed. The patient's tolerance of                            previous anesthesia was also reviewed. The risks                            and benefits of the procedure and the sedation                            options and risks were discussed with the patient.                            All questions were answered, and informed consent                            was obtained. Prior Anticoagulants: The patient has                            taken no previous anticoagulant or antiplatelet                            agents. ASA Grade Assessment: II - A patient with                            mild systemic disease. After reviewing the risks                            and benefits, the patient was deemed in  satisfactory condition to undergo the procedure.                           After obtaining informed consent, the colonoscope                            was passed under direct vision. Throughout the                            procedure, the patient's blood pressure, pulse, and                            oxygen saturations were monitored continuously. The                            Colonoscope was  introduced through the anus and                            advanced to the the terminal ileum, with                            identification of the appendiceal orifice and IC                            valve. The colonoscopy was performed without                            difficulty. The patient tolerated the procedure                            well. The quality of the bowel preparation was                            good. The terminal ileum, ileocecal valve,                            appendiceal orifice, and rectum were photographed. Scope In: 3:11:30 PM Scope Out: 3:30:25 PM Scope Withdrawal Time: 0 hours 14 minutes 44 seconds  Total Procedure Duration: 0 hours 18 minutes 55 seconds  Findings:                 The perianal and digital rectal examinations were                            normal. Pertinent negatives include normal prostate                            (size, shape, and consistency).                           Four sessile polyps were found in the sigmoid colon                            and descending colon. The polyps were diminutive in  size. These polyps were removed with a cold snare.                            Resection was complete, but the polyp tissue was                            only partially retrieved. Verification of patient                            identification for the specimen was done. Estimated                            blood loss was minimal.                           Multiple diverticula were found in the sigmoid                            colon.                           The terminal ileum appeared normal.                           The exam was otherwise without abnormality on                            direct and retroflexion views.                           Biopsies for histology were taken with a cold                            forceps from the right colon and left colon for                            evaluation of  microscopic colitis. Estimated blood                            loss was minimal. Complications:            No immediate complications. Estimated Blood Loss:     Estimated blood loss was minimal. Impression:               - Four diminutive polyps in the sigmoid colon and                            in the descending colon, removed with a cold snare.                            Complete resection. Partial retrieval.                           - Diverticulosis in the sigmoid colon.                           -  The examined portion of the ileum was normal.                           - The examination was otherwise normal on direct                            and retroflexion views.                           - Biopsies were taken with a cold forceps from the                            right colon and left colon for evaluation of                            microscopic colitis.                           - Personal history of colonic polyps.4 adenomas                            2017 and mother had CRCA Recommendation:           - Patient has a contact number available for                            emergencies. The signs and symptoms of potential                            delayed complications were discussed with the                            patient. Return to normal activities tomorrow.                            Written discharge instructions were provided to the                            patient.                           - Clear liquids x 1 hour then soft foods rest of                            day. Start prior diet tomorrow.                           - Continue present medications.                           - Repeat colonoscopy is recommended. The                            colonoscopy date will be determined after pathology  results from today's exam become available for                            review. Gatha Mayer, MD 05/20/2020 3:52:53 PM This report has  been signed electronically.

## 2020-05-20 NOTE — Op Note (Signed)
[facility] Patient Name: Dennis Orr Procedure Date: 05/20/2020 2:41 PM MRN: [id number] Endoscopist: Gatha Mayer , MD Age: 64 Referring MD:  Date of Birth: [birth year] Gender: Male Account #: [id number] Procedure:                Upper GI endoscopy Indications:              Dysphagia Medicines:                Propofol per Anesthesia, Monitored Anesthesia Care Procedure:                Pre-Anesthesia Assessment:                           - Prior to the procedure, a History and Physical                            was performed, and patient medications and                            allergies were reviewed. The patient's tolerance of                            previous anesthesia was also reviewed. The risks                            and benefits of the procedure and the sedation                            options and risks were discussed with the patient.                            All questions were answered, and informed consent                            was obtained. Prior Anticoagulants: The patient has                            taken no previous anticoagulant or antiplatelet                            agents. ASA Grade Assessment: II - A patient with                            mild systemic disease. After reviewing the risks                            and benefits, the patient was deemed in                            satisfactory condition to undergo the procedure.                           After obtaining informed consent, the endoscope was  passed under direct vision. Throughout the                            procedure, the patient's blood pressure, pulse, and                            oxygen saturations were monitored continuously. The                            Endoscope was introduced through the mouth, and                            advanced to the second part of duodenum. The upper                            GI endoscopy was  accomplished without difficulty.                            The patient tolerated the procedure well. Scope In: Scope Out: Findings:                 LA Grade B (one or more mucosal breaks greater than                            5 mm, not extending between the tops of two mucosal                            folds) esophagitis was found in the lower third of                            the esophagus. Biopsies were taken with a cold                            forceps for histology. Verification of patient                            identification for the specimen was done. Estimated                            blood loss was minimal.                           Mucosal changes including longitudinal furrows and                            circumferential folds were found in the upper third                            of the esophagus and in the middle third of the                            esophagus. Biopsies were obtained from the proximal  and distal esophagus with cold forceps for                            histology of suspected eosinophilic esophagitis.                            Verification of patient identification for the                            specimen was done. Estimated blood loss was minimal.                           One benign-appearing, intrinsic moderate stenosis                            was found at the gastroesophageal junction. The                            stenosis was traversed. A TTS dilator was passed                            through the scope. Dilation with an 18-19-20 mm                            balloon dilator was performed to 20 mm. The                            dilation site was examined and showed mild mucosal                            disruption. Estimated blood loss was minimal.                           A 5 cm hiatal hernia was present.                           Multiple diminutive semi-sessile polyps were found                             in the gastric fundus and in the gastric body.                            Biopsies were taken with a cold forceps for                            histology. Verification of patient identification                            for the specimen was done. Estimated blood loss was                            minimal.  Diffuse inflammation characterized by erosions,                            erythema, friability and granularity was found in                            the gastric body and in the gastric antrum.                            Biopsies were taken with a cold forceps for                            histology. Verification of patient identification                            for the specimen was done. Estimated blood loss was                            minimal.                           The exam was otherwise without abnormality.                           The cardia and gastric fundus were normal on                            retroflexion. Complications:            No immediate complications. Estimated Blood Loss:     Estimated blood loss was minimal. Impression:               - LA Grade B reflux esophagitis. Biopsied.                           - Esophageal mucosal changes suggestive of                            eosinophilic esophagitis. Biopsied.                           - Benign-appearing esophageal stenosis. Dilated.                           - 5 cm hiatal hernia.                           - Multiple gastric polyps. Biopsied.                           - Gastritis. Biopsied.                           - The examination was otherwise normal. Recommendation:           - Patient has a contact number available for  emergencies. The signs and symptoms of potential                            delayed complications were discussed with the                            patient. Return to normal activities tomorrow.                             Written discharge instructions were provided to the                            patient.                           - Clear liquids x 1 hour then soft foods rest of                            day. Start prior diet tomorrow.                           - Continue present medications.                           - Await pathology results.                           - Follow an antireflux regimen.                           - Use Nexium (esomeprazole) 40 mg PO daily. Gatha Mayer, MD 05/20/2020 3:48:04 PM This report has been signed electronically.

## 2020-05-20 NOTE — Progress Notes (Signed)
Pt's states no medical or surgical changes since previsit or office visit.  CW - vitals

## 2020-05-20 NOTE — Progress Notes (Signed)
Called to room to assist during endoscopic procedure.  Patient ID and intended procedure confirmed with present staff. Received instructions for my participation in the procedure from the performing physician.  

## 2020-05-22 ENCOUNTER — Telehealth: Payer: Self-pay

## 2020-05-22 NOTE — Telephone Encounter (Signed)
  Follow up Call-  Call back number 05/20/2020  Post procedure Call Back phone  # [phone]  Permission to leave phone message Yes  Some recent data might be hidden     Patient questions:  Do you have a fever, pain , or abdominal swelling? No. Pain Score  0 *  Have you tolerated food without any problems? Yes.    Have you been able to return to your normal activities? Yes.    Do you have any questions about your discharge instructions: Diet   No. Medications  No. Follow up visit  No.  Do you have questions or concerns about your Care? No.  Actions: * If pain score is 4 or above: 1. No action needed, pain <4.Have you developed a fever since your procedure? no  2.   Have you had an respiratory symptoms (SOB or cough) since your procedure? no  3.   Have you tested positive for COVID 19 since your procedure no  4.   Have you had any family members/close contacts diagnosed with the COVID 19 since your procedure?  no   If yes to any of these questions please route to Joylene John, RN and Erenest Rasher, RN

## 2020-07-24 ENCOUNTER — Ambulatory Visit: Payer: BC Managed Care – PPO | Admitting: Internal Medicine

## 2020-09-04 ENCOUNTER — Other Ambulatory Visit: Payer: Self-pay

## 2020-09-04 ENCOUNTER — Ambulatory Visit: Payer: BC Managed Care – PPO | Admitting: Family Medicine

## 2020-09-04 ENCOUNTER — Encounter: Payer: Self-pay | Admitting: Family Medicine

## 2020-09-04 VITALS — BP 142/74 | HR 81 | Temp 98.0°F | Ht 67.0 in | Wt 190.6 lb

## 2020-09-04 DIAGNOSIS — E559 Vitamin D deficiency, unspecified: Secondary | ICD-10-CM

## 2020-09-04 DIAGNOSIS — R7989 Other specified abnormal findings of blood chemistry: Secondary | ICD-10-CM | POA: Diagnosis not present

## 2020-09-04 DIAGNOSIS — Z23 Encounter for immunization: Secondary | ICD-10-CM

## 2020-09-04 DIAGNOSIS — E7800 Pure hypercholesterolemia, unspecified: Secondary | ICD-10-CM

## 2020-09-04 DIAGNOSIS — G4700 Insomnia, unspecified: Secondary | ICD-10-CM

## 2020-09-04 DIAGNOSIS — I10 Essential (primary) hypertension: Secondary | ICD-10-CM

## 2020-09-04 DIAGNOSIS — K219 Gastro-esophageal reflux disease without esophagitis: Secondary | ICD-10-CM

## 2020-09-04 MED ORDER — ZOLPIDEM TARTRATE 10 MG PO TABS: 10.0000 mg | ORAL_TABLET | Freq: Every evening | ORAL | 1 refills | Status: AC | PRN

## 2020-09-04 MED ORDER — ATORVASTATIN CALCIUM 20 MG PO TABS: 20.0000 mg | ORAL_TABLET | Freq: Every day | ORAL | 3 refills | Status: AC

## 2020-09-04 MED ORDER — OLMESARTAN MEDOXOMIL 20 MG PO TABS: 20.0000 mg | ORAL_TABLET | Freq: Every day | ORAL | 2 refills | Status: AC

## 2020-09-04 MED ORDER — ESOMEPRAZOLE MAGNESIUM 20 MG PO CPDR: 20.0000 mg | DELAYED_RELEASE_CAPSULE | Freq: Every day | ORAL | 1 refills | Status: AC

## 2020-09-04 NOTE — Patient Instructions (Addendum)
   I am checking labs on you today and I will let you know the results of those.  You are encouraged to try and lose a little weight which would help keep your blood pressure under control.  The Nexium dose was reduced to 20 mg daily which is a safer dose for long-term use and we will try that.  Let us know if it does not do the job.  Take over-the-counter vitamin D 800 to 1000 units and see if that will maintain your level good.  Return in about 6 months or as needed.  If you back to trying the CPAP again consider using the ResMed nasal Swift which I think works great.  If you have lab work done today you will be contacted with your lab results within the next 2 weeks.  If you have not heard from [place] then please contact us. The fastest way to get your results is to register for My Chart.   IF you received an x-ray today, you will receive an invoice from [facility]. Please contact [facility] at [phone] with questions or concerns regarding your invoice.   IF you received labwork today, you will receive an invoice from [facility]. Please contact [facility] at [phone] with questions or concerns regarding your invoice.   Our billing staff will not be able to assist you with questions regarding bills from these companies.  You will be contacted with the lab results as soon as they are available. The fastest way to get your results is to activate your My Chart account. Instructions are located on the last page of this paperwork. If you have not heard from [place] regarding the results in 2 weeks, please contact this office.

## 2020-09-04 NOTE — Progress Notes (Signed)
Patient ID: Dennis Orr, male    DOB: 01/24/1956  Age: 64 y.o. MRN: [id number]  Chief Complaint  Patient presents with  . Medical Management of Chronic Issues    med refill   . Hypertension    Subjective:  Patient works for the city of [place].  He does a lot of physical activity and a lot of it is out in the sun.  He needs refills of his medications  He denies headaches or dizziness or chest pains or palpitations.  Takes his medicines faithfully.  He is out of his vitamin D.   Current allergies, medications, problem list, past/family and social histories reviewed.  Objective:  BP (!) 142/74   Pulse 81   Temp 98 F (36.7 C) (Temporal)   Ht 5' 7"  (1.702 m)   Wt 190 lb 9.6 oz (86.5 kg)   SpO2 98%   BMI 29.85 kg/m   No acute distress.  Vitals are stable except for borderline systolic.  His throat is clear.  Neck supple without nodes.  Chest clear.  Heart rate without murmurs.  Abdomen soft mass or tenderness.  Assessment & Plan:   Assessment: 1. Essential hypertension   2. Need for immunization against influenza   3. Vitamin D deficiency   4. Elevated TSH   5. Elevated cholesterol   6. Insomnia, unspecified type   7. Gastroesophageal reflux disease without esophagitis       Plan: See instructions.  Orders Placed This Encounter  Procedures  . Flu Vaccine QUAD 36+ mos IM  . TSH  . Comprehensive metabolic panel  . Lipid panel    Meds ordered this encounter  Medications  . zolpidem (AMBIEN) 10 MG tablet    Sig: Take 1 tablet (10 mg total) by mouth at bedtime as needed for sleep.    Dispense:  45 tablet    Refill:  1  . atorvastatin (LIPITOR) 20 MG tablet    Sig: Take 1 tablet (20 mg total) by mouth daily.    Dispense:  90 tablet    Refill:  3  . esomeprazole (NEXIUM) 20 MG capsule    Sig: Take 1 capsule (20 mg total) by mouth daily before breakfast.    Dispense:  90 capsule    Refill:  1  . olmesartan (BENICAR) 20 MG tablet    Sig: Take 1 tablet (20  mg total) by mouth daily. Schedule Office Visit    Dispense:  90 tablet    Refill:  2    Due for OV with new pcp         Patient Instructions     I am checking labs on you today and I will let you know the results of those.  You are encouraged to try and lose a little weight which would help keep your blood pressure under control.  The Nexium dose was reduced to 20 mg daily which is a safer dose for long-term use and we will try that.  Let us know if it does not do the job.  Take over-the-counter vitamin D 800 to 1000 units and see if that will maintain your level good.  Return in about 6 months or as needed.  If you back to trying the CPAP again consider using the ResMed nasal Swift which I think works great.  If you have lab work done today you will be contacted with your lab results within the next 2 weeks.  If you have not heard from [place] then please  contact us. The fastest way to get your results is to register for My Chart.   IF you received an x-ray today, you will receive an invoice from [facility]. Please contact [facility] at [phone] with questions or concerns regarding your invoice.   IF you received labwork today, you will receive an invoice from [facility]. Please contact [facility] at [phone] with questions or concerns regarding your invoice.   Our billing staff will not be able to assist you with questions regarding bills from these companies.  You will be contacted with the lab results as soon as they are available. The fastest way to get your results is to activate your My Chart account. Instructions are located on the last page of this paperwork. If you have not heard from [place] regarding the results in 2 weeks, please contact this office.         Return in about 6 months (around 03/05/2021).   Ruben Reason, MD 09/04/2020

## 2020-09-05 LAB — COMPREHENSIVE METABOLIC PANEL
ALT: 16 IU/L (ref 0–44)
AST: 18 IU/L (ref 0–40)
Albumin/Globulin Ratio: 2 (ref 1.2–2.2)
Albumin: 4.8 g/dL (ref 3.8–4.8)
Alkaline Phosphatase: 78 IU/L (ref 44–121)
BUN/Creatinine Ratio: 13 (ref 10–24)
BUN: 14 mg/dL (ref 8–27)
Bilirubin Total: 0.3 mg/dL (ref 0.0–1.2)
CO2: 24 mmol/L (ref 20–29)
Calcium: 10 mg/dL (ref 8.6–10.2)
Chloride: 104 mmol/L (ref 96–106)
Creatinine, Ser: 1.12 mg/dL (ref 0.76–1.27)
GFR calc Af Amer: 80 mL/min/{1.73_m2} (ref >59)
GFR calc non Af Amer: 70 mL/min/{1.73_m2} (ref >59)
Globulin, Total: 2.4 g/dL (ref 1.5–4.5)
Glucose: 103 mg/dL — ABNORMAL HIGH (ref 65–99)
Potassium: 4.6 mmol/L (ref 3.5–5.2)
Sodium: 140 mmol/L (ref 134–144)
Total Protein: 7.2 g/dL (ref 6.0–8.5)

## 2020-09-05 LAB — LIPID PANEL
Chol/HDL Ratio: 3 ratio (ref 0.0–5.0)
Cholesterol, Total: 200 mg/dL — ABNORMAL HIGH (ref 100–199)
HDL: 66 mg/dL (ref >39)
LDL Chol Calc (NIH): 99 mg/dL (ref 0–99)
Triglycerides: 210 mg/dL — ABNORMAL HIGH (ref 0–149)
VLDL Cholesterol Cal: 35 mg/dL (ref 5–40)

## 2020-09-05 LAB — TSH: TSH: 6.86 u[IU]/mL — ABNORMAL HIGH (ref 0.450–4.500)

## 2020-09-08 ENCOUNTER — Other Ambulatory Visit: Payer: Self-pay | Admitting: Family Medicine

## 2020-09-08 DIAGNOSIS — E039 Hypothyroidism, unspecified: Secondary | ICD-10-CM

## 2020-09-08 MED ORDER — LEVOTHYROXINE SODIUM 50 MCG PO TABS: ORAL_TABLET | ORAL | 1 refills | Status: AC

## 2020-12-09 ENCOUNTER — Other Ambulatory Visit: Payer: Self-pay | Admitting: Family Medicine

## 2020-12-09 DIAGNOSIS — G4700 Insomnia, unspecified: Secondary | ICD-10-CM

## 2020-12-10 NOTE — Telephone Encounter (Signed)
Patient is requesting a refill of the following medications: Requested Prescriptions   Pending Prescriptions Disp Refills   zolpidem (AMBIEN) 10 MG tablet [Pharmacy Med Name: ZOLPIDEM TARTRATE 10 MG TABLET] 45 tablet 1    Sig: TAKE 1 TABLET BY MOUTH AT BEDTIME AS NEEDED FOR SLEEP    Date of patient request: 12/10/20 Last office visit: 09/04/20 Date of last refill: 09/04/20 Last refill amount: 45 rf-1 Follow up time period per chart:

## 2020-12-10 NOTE — Telephone Encounter (Signed)
Requested medication (s) are due for refill today: Yes  Requested medication (s) are on the active medication list: Yes  Last refill:  09/04/20  Future visit scheduled: No  Notes to clinic:  See request.    Requested Prescriptions  Pending Prescriptions Disp Refills   zolpidem (AMBIEN) 10 MG tablet [Pharmacy Med Name: ZOLPIDEM TARTRATE 10 MG TABLET] 45 tablet 1    Sig: TAKE 1 TABLET BY MOUTH AT BEDTIME AS NEEDED FOR SLEEP      Not Delegated - Psychiatry:  Anxiolytics/Hypnotics Failed - 12/09/2020  4:57 PM      Failed - This refill cannot be delegated      Failed - Urine Drug Screen completed in last 360 days      Passed - Valid encounter within last 6 months    Recent Outpatient Visits           3 months ago Essential hypertension   Primary Care at [facility], Fenton Malling, MD   8 months ago Essential hypertension   Primary Care at [facility], Lorelee Market, NP   1 year ago Essential hypertension   Primary Care at [facility], Lorelee Market, NP   1 year ago Insomnia, unspecified type   Primary Care at [facility], Lorelee Market, NP   2 years ago Insomnia, unspecified type   Primary Care at [facility], [place] D, [place]

## 2020-12-13 ENCOUNTER — Encounter: Payer: Self-pay | Admitting: Family Medicine

## 2023-05-15 ENCOUNTER — Other Ambulatory Visit: Payer: Self-pay | Admitting: Orthopedic Surgery

## 2023-06-23 ENCOUNTER — Encounter (HOSPITAL_BASED_OUTPATIENT_CLINIC_OR_DEPARTMENT_OTHER): Payer: Self-pay | Admitting: Orthopedic Surgery

## 2023-06-23 ENCOUNTER — Other Ambulatory Visit: Payer: Self-pay

## 2023-06-28 NOTE — Progress Notes (Signed)
Patient reminded to come in today to [facility] to receive their ERAS presurgical drink and to complete their EKG prior to their surgery on 06/29/23 with Dr. Merlyn Lot.

## 2023-06-29 ENCOUNTER — Other Ambulatory Visit: Payer: Self-pay

## 2023-06-29 ENCOUNTER — Encounter (HOSPITAL_BASED_OUTPATIENT_CLINIC_OR_DEPARTMENT_OTHER)
Admission: RE | Disposition: A | Discharge status: Home / Self Care | Payer: Self-pay | Source: Home / Self Care | Attending: Orthopedic Surgery

## 2023-06-29 ENCOUNTER — Ambulatory Visit (HOSPITAL_BASED_OUTPATIENT_CLINIC_OR_DEPARTMENT_OTHER): Payer: BC Managed Care – PPO | Admitting: Anesthesiology

## 2023-06-29 ENCOUNTER — Ambulatory Visit (HOSPITAL_BASED_OUTPATIENT_CLINIC_OR_DEPARTMENT_OTHER)
Admission: RE | Admit: 2023-06-29 | Discharge: 2023-06-29 | Disposition: A | Discharge status: Home / Self Care | Payer: BC Managed Care – PPO | Attending: Orthopedic Surgery | Admitting: Orthopedic Surgery

## 2023-06-29 ENCOUNTER — Encounter (HOSPITAL_BASED_OUTPATIENT_CLINIC_OR_DEPARTMENT_OTHER): Payer: Self-pay | Admitting: Orthopedic Surgery

## 2023-06-29 DIAGNOSIS — Z87891 Personal history of nicotine dependence: Secondary | ICD-10-CM | POA: Diagnosis not present

## 2023-06-29 DIAGNOSIS — M720 Palmar fascial fibromatosis [Dupuytren]: Secondary | ICD-10-CM | POA: Diagnosis not present

## 2023-06-29 DIAGNOSIS — Z8711 Personal history of peptic ulcer disease: Secondary | ICD-10-CM | POA: Diagnosis not present

## 2023-06-29 DIAGNOSIS — Z79899 Other long term (current) drug therapy: Secondary | ICD-10-CM | POA: Insufficient documentation

## 2023-06-29 DIAGNOSIS — I10 Essential (primary) hypertension: Secondary | ICD-10-CM | POA: Diagnosis not present

## 2023-06-29 DIAGNOSIS — G4730 Sleep apnea, unspecified: Secondary | ICD-10-CM | POA: Insufficient documentation

## 2023-06-29 DIAGNOSIS — K219 Gastro-esophageal reflux disease without esophagitis: Secondary | ICD-10-CM | POA: Insufficient documentation

## 2023-06-29 HISTORY — PX: DUPUYTREN CONTRACTURE RELEASE: SHX1478

## 2023-06-29 HISTORY — DX: Sleep apnea, unspecified: G47.30

## 2023-06-29 SURGERY — RELEASE, DUPUYTREN CONTRACTURE
Anesthesia: General | Site: Hand | Laterality: Right

## 2023-06-29 MED ORDER — ONDANSETRON HCL 4 MG/2ML IJ SOLN
INTRAMUSCULAR | Status: AC
  Filled 2023-06-29: qty 2

## 2023-06-29 MED ORDER — MIDAZOLAM HCL 5 MG/5ML IJ SOLN
INTRAMUSCULAR | Status: DC | PRN
  Administered 2023-06-29 (×2): 1 mg via INTRAVENOUS

## 2023-06-29 MED ORDER — FENTANYL CITRATE (PF) 100 MCG/2ML IJ SOLN
INTRAMUSCULAR | Status: DC | PRN
  Administered 2023-06-29 (×4): 50 ug via INTRAVENOUS

## 2023-06-29 MED ORDER — BUPIVACAINE HCL (PF) 0.25 % IJ SOLN
INTRAMUSCULAR | Status: DC | PRN
  Administered 2023-06-29: 9 mL

## 2023-06-29 MED ORDER — FENTANYL CITRATE (PF) 100 MCG/2ML IJ SOLN
INTRAMUSCULAR | Status: AC
  Filled 2023-06-29: qty 2

## 2023-06-29 MED ORDER — KETOROLAC TROMETHAMINE 30 MG/ML IJ SOLN
INTRAMUSCULAR | Status: DC | PRN
Start: 2023-06-29 — End: 2023-06-29
  Administered 2023-06-29: 30 mg via INTRAVENOUS

## 2023-06-29 MED ORDER — LACTATED RINGERS IV SOLN: INTRAVENOUS | Status: DC

## 2023-06-29 MED ORDER — MIDAZOLAM HCL 2 MG/2ML IJ SOLN
INTRAMUSCULAR | Status: AC
  Filled 2023-06-29: qty 2

## 2023-06-29 MED ORDER — PROPOFOL 10 MG/ML IV BOLUS
INTRAVENOUS | Status: AC
  Filled 2023-06-29: qty 20

## 2023-06-29 MED ORDER — ONDANSETRON HCL 4 MG/2ML IJ SOLN
INTRAMUSCULAR | Status: DC | PRN
  Administered 2023-06-29: 4 mg via INTRAVENOUS

## 2023-06-29 MED ORDER — ACETAMINOPHEN 500 MG PO TABS
1000.0000 mg | ORAL_TABLET | Freq: Once | ORAL | Status: AC
  Administered 2023-06-29: 1000 mg via ORAL

## 2023-06-29 MED ORDER — DEXAMETHASONE SODIUM PHOSPHATE 10 MG/ML IJ SOLN
INTRAMUSCULAR | Status: AC
  Filled 2023-06-29: qty 1

## 2023-06-29 MED ORDER — LIDOCAINE HCL (CARDIAC) PF 100 MG/5ML IV SOSY
PREFILLED_SYRINGE | INTRAVENOUS | Status: DC | PRN
  Administered 2023-06-29: 80 mg via INTRAVENOUS

## 2023-06-29 MED ORDER — PROPOFOL 10 MG/ML IV BOLUS
INTRAVENOUS | Status: DC | PRN
Start: 2023-06-29 — End: 2023-06-29
  Administered 2023-06-29: 180 mg via INTRAVENOUS
  Administered 2023-06-29: 20 mg via INTRAVENOUS

## 2023-06-29 MED ORDER — ONDANSETRON HCL 4 MG/2ML IJ SOLN: 4.0000 mg | Freq: Once | INTRAMUSCULAR | Status: DC | PRN

## 2023-06-29 MED ORDER — THROMBIN 5000 UNITS EX SOLR
CUTANEOUS | Status: DC | PRN
  Administered 2023-06-29: 5000 [IU] via TOPICAL

## 2023-06-29 MED ORDER — 0.9 % SODIUM CHLORIDE (POUR BTL) OPTIME
TOPICAL | Status: DC | PRN
  Administered 2023-06-29: 100 mL

## 2023-06-29 MED ORDER — FENTANYL CITRATE (PF) 100 MCG/2ML IJ SOLN
25.0000 ug | INTRAMUSCULAR | Status: DC | PRN
  Administered 2023-06-29 (×2): 50 ug via INTRAVENOUS

## 2023-06-29 MED ORDER — LIDOCAINE 2% (20 MG/ML) 5 ML SYRINGE
INTRAMUSCULAR | Status: AC
  Filled 2023-06-29: qty 5

## 2023-06-29 MED ORDER — HYDROCODONE-ACETAMINOPHEN 5-325 MG PO TABS: ORAL_TABLET | ORAL | 0 refills | Status: AC

## 2023-06-29 MED ORDER — HYDROCODONE-ACETAMINOPHEN 5-325 MG PO TABS: ORAL_TABLET | ORAL | 0 refills | Status: DC

## 2023-06-29 MED ORDER — THROMBIN 5000 UNITS EX SOLR
CUTANEOUS | Status: AC
  Filled 2023-06-29: qty 5000

## 2023-06-29 MED ORDER — DEXAMETHASONE SODIUM PHOSPHATE 10 MG/ML IJ SOLN
INTRAMUSCULAR | Status: DC | PRN
  Administered 2023-06-29: 10 mg via INTRAVENOUS

## 2023-06-29 MED ORDER — CEFAZOLIN SODIUM-DEXTROSE 2-4 GM/100ML-% IV SOLN
2.0000 g | INTRAVENOUS | Status: AC
  Administered 2023-06-29: 2 g via INTRAVENOUS

## 2023-06-29 MED ORDER — KETOROLAC TROMETHAMINE 30 MG/ML IJ SOLN
INTRAMUSCULAR | Status: AC
  Filled 2023-06-29: qty 1

## 2023-06-29 MED ORDER — ACETAMINOPHEN 500 MG PO TABS
ORAL_TABLET | ORAL | Status: AC
  Filled 2023-06-29: qty 2

## 2023-06-29 MED ORDER — EPHEDRINE SULFATE (PRESSORS) 50 MG/ML IJ SOLN
INTRAMUSCULAR | Status: DC | PRN
Start: 2023-06-29 — End: 2023-06-29
  Administered 2023-06-29: 5 mg via INTRAVENOUS

## 2023-06-29 MED ORDER — CEFAZOLIN SODIUM-DEXTROSE 2-4 GM/100ML-% IV SOLN
INTRAVENOUS | Status: AC
  Filled 2023-06-29: qty 100

## 2023-06-29 SURGICAL SUPPLY — 51 items
APL PRP STRL LF DISP 70% ISPRP (MISCELLANEOUS) ×1
BLADE MINI RND TIP GREEN BEAV (BLADE) IMPLANT
BLADE SURG 15 STRL LF DISP TIS (BLADE) ×2 IMPLANT
BLADE SURG 15 STRL SS (BLADE) ×2
BNDG CMPR 5X3 KNIT ELC UNQ LF (GAUZE/BANDAGES/DRESSINGS) ×1
BNDG CMPR 9X4 STRL LF SNTH (GAUZE/BANDAGES/DRESSINGS) ×1
BNDG ELASTIC 3INX 5YD STR LF (GAUZE/BANDAGES/DRESSINGS) ×1 IMPLANT
BNDG ESMARK 4X9 LF (GAUZE/BANDAGES/DRESSINGS) IMPLANT
BNDG GAUZE DERMACEA FLUFF 4 (GAUZE/BANDAGES/DRESSINGS) ×1 IMPLANT
BNDG GZE DERMACEA 4 6PLY (GAUZE/BANDAGES/DRESSINGS) ×1
CHLORAPREP W/TINT 26 (MISCELLANEOUS) ×1 IMPLANT
CORD BIPOLAR FORCEPS 12FT (ELECTRODE) ×1 IMPLANT
COVER BACK TABLE 60X90IN (DRAPES) ×1 IMPLANT
COVER MAYO STAND STRL (DRAPES) ×1 IMPLANT
CUFF TOURN SGL QUICK 18X4 (TOURNIQUET CUFF) ×1 IMPLANT
DRAPE EXTREMITY T 121X128X90 (DISPOSABLE) ×1 IMPLANT
DRAPE SURG 17X23 STRL (DRAPES) ×1 IMPLANT
GAUZE SPONGE 4X4 12PLY STRL (GAUZE/BANDAGES/DRESSINGS) ×1 IMPLANT
GAUZE XEROFORM 1X8 LF (GAUZE/BANDAGES/DRESSINGS) ×1 IMPLANT
GLOVE BIO SURGEON STRL SZ7 (GLOVE) IMPLANT
GLOVE BIO SURGEON STRL SZ7.5 (GLOVE) ×1 IMPLANT
GLOVE BIOGEL PI IND STRL 6.5 (GLOVE) IMPLANT
GLOVE BIOGEL PI IND STRL 7.0 (GLOVE) IMPLANT
GLOVE BIOGEL PI IND STRL 7.5 (GLOVE) IMPLANT
GLOVE BIOGEL PI IND STRL 8 (GLOVE) IMPLANT
GLOVE BIOGEL PI IND STRL 8.5 (GLOVE) IMPLANT
GLOVE SURG ORTHO 8.0 STRL STRW (GLOVE) IMPLANT
GLOVE SURG SS PI 6.5 STRL IVOR (GLOVE) IMPLANT
GOWN STRL REUS W/ TWL LRG LVL3 (GOWN DISPOSABLE) ×1 IMPLANT
GOWN STRL REUS W/ TWL XL LVL3 (GOWN DISPOSABLE) IMPLANT
GOWN STRL REUS W/TWL LRG LVL3 (GOWN DISPOSABLE) ×1
GOWN STRL REUS W/TWL XL LVL3 (GOWN DISPOSABLE) ×2 IMPLANT
LOOP VASCLR MAXI BLUE 18IN ST (MISCELLANEOUS) IMPLANT
LOOP VASCULAR MAXI 18 BLUE (MISCELLANEOUS) ×1
LOOPS VASCLR MAXI BLUE 18IN ST (MISCELLANEOUS) ×1 IMPLANT
NDL HYPO 25X1 1.5 SAFETY (NEEDLE) ×1 IMPLANT
NEEDLE HYPO 25X1 1.5 SAFETY (NEEDLE) ×1 IMPLANT
NS IRRIG 1000ML POUR BTL (IV SOLUTION) ×1 IMPLANT
PACK BASIN DAY SURGERY FS (CUSTOM PROCEDURE TRAY) ×1 IMPLANT
PAD CAST 3X4 CTTN HI CHSV (CAST SUPPLIES) ×1 IMPLANT
PADDING CAST ABS COTTON 4X4 ST (CAST SUPPLIES) ×1 IMPLANT
PADDING CAST COTTON 3X4 STRL (CAST SUPPLIES) ×1
SPLINT PLASTER CAST XFAST 3X15 (CAST SUPPLIES) IMPLANT
STOCKINETTE 4X48 STRL (DRAPES) ×1 IMPLANT
SUT ETHILON 4 0 PS 2 18 (SUTURE) ×1 IMPLANT
SUT SILK 4 0 PS 2 (SUTURE) IMPLANT
SYR BULB EAR ULCER 3OZ GRN STR (SYRINGE) ×1 IMPLANT
SYR CONTROL 10ML LL (SYRINGE) ×1 IMPLANT
TOWEL GREEN STERILE FF (TOWEL DISPOSABLE) ×2 IMPLANT
UNDERPAD 30X36 HEAVY ABSORB (UNDERPADS AND DIAPERS) ×1 IMPLANT
VASCULAR TIE MAXI BLUE 18IN ST (MISCELLANEOUS) ×1

## 2023-06-29 NOTE — Anesthesia Preprocedure Evaluation (Addendum)
Anesthesia Evaluation  Patient identified by MRN, date of birth, ID band Patient awake    Reviewed: Allergy & Precautions, NPO status , Patient's Chart, lab work & pertinent test results  Airway Mallampati: III  TM Distance: >3 FB Neck ROM: Full    Dental  (+) Teeth Intact, Dental Advisory Given   Pulmonary sleep apnea , former smoker   Pulmonary exam normal breath sounds clear to auscultation       Cardiovascular hypertension, Pt. 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  Reproductive/Obstetrics                             Anesthesia Physical Anesthesia Plan  ASA: 2  Anesthesia Plan: General   Post-op Pain Management: Tylenol PO (pre-op)* and Toradol IV (intra-op)*   Induction: Intravenous  PONV Risk Score and Plan: 2 and Midazolam, Dexamethasone and Ondansetron  Airway Management Planned: LMA  Additional Equipment:   Intra-op Plan:   Post-operative Plan: Extubation in OR  Informed Consent: I have reviewed the patients History and Physical, chart, labs and discussed the procedure including the risks, benefits and alternatives for the proposed anesthesia with the patient or authorized representative who has indicated his/her understanding and acceptance.     Dental advisory given  Plan Discussed with: CRNA  Anesthesia Plan Comments:        Anesthesia Quick Evaluation

## 2023-06-29 NOTE — Op Note (Signed)
NAME: Dennis Orr MEDICAL RECORD NO: [id number] DATE OF BIRTH: [birth year] FACILITY: [facility] LOCATION: [facility] PHYSICIAN: Tami Ribas, MD   OPERATIVE REPORT   DATE OF PROCEDURE: 06/29/23    PREOPERATIVE DIAGNOSIS: Right ring finger Dupuytren's contracture   POSTOPERATIVE DIAGNOSIS: Right ring finger Dupuytren's contracture   PROCEDURE: Right palm and ring finger fasciectomy   SURGEON:  Betha Loa, M.D.   ASSISTANT: Cindee Salt, MD   ANESTHESIA:  General   INTRAVENOUS FLUIDS:  Per anesthesia flow sheet.   ESTIMATED BLOOD LOSS:  Minimal.   COMPLICATIONS:  None.   SPECIMENS: Right ring finger palmar fascia to pathology   TOURNIQUET TIME:    Total Tourniquet Time Documented: Upper Arm (Right) - 58 minutes Total: Upper Arm (Right) - 58 minutes    DISPOSITION:  Stable to PACU.   INDICATIONS: 67 year old male with contracture of right ring finger.  This is bothersome to him.  He wishes to have palm and ring finger fasciectomy.  Risks, benefits and alternatives of surgery were discussed including the risks of blood loss, infection, damage to nerves, vessels, tendons, ligaments, bone for surgery, need for additional surgery, complications with wound healing, continued pain, stiffness, , recurrence.  He voiced understanding of these risks and elected to proceed.  OPERATIVE COURSE:  After being identified preoperatively by myself,  the patient and I agreed on the procedure and site of the procedure.  The surgical site was marked.  Surgical consent had been signed. Preoperative IV antibiotic prophylaxis was given. He was transferred to the operating room and placed on the operating table in supine position with the Right upper extremity on an arm board.  General anesthesia was induced by the anesthesiologist.  Right upper extremity was prepped and draped in normal sterile orthopedic fashion.  A surgical pause was performed between the surgeons, anesthesia, and  operating room staff and all were in agreement as to the patient, procedure, and site of procedure.  Tourniquet at the proximal aspect of the extremity was inflated to 250 mmHg after exsanguination of the arm with an Esmarch bandage.  A Bruner incision was started in the palm over the palpable cord.  This was carried in subcutaneous tissues by spreading technique.  The cord was easily identified.  The neurovascular bundles on the radial and ulnar sides of the cord were identified and protected throughout the case.  The incision was extended distally over the cord.  The cord was transected proximally while protecting the neurovascular bundles.  It was then freed up of its attachments from proximal to distal.  The incision was extended progressively to allow access to the cord as the finger came into a more extended position.  The radial and ulnar digital nerve and artery were identified and protected throughout the case.  These were traced distally and the cord progressively released.  The cord went to the middle of the middle phalanx.  It was removed and sent to pathology for examination.  The radial and ulnar digital nerve and artery were visualized and were intact.  The finger was able to come into full extension.  The wound was copiously irrigated with sterile saline.  It was then treated with thrombin spray.  A vessel loop drain was placed.  The corners of the incision were deepened.  The wound was closed with 4-0 nylon in a horizontal mattress fashion.  Was injected with quarter percent plain Marcaine to aid in postoperative analgesia.  The wound was then dressed with sterile Xeroform  4 x 4's and wrapped with a Kerlix bandage.  The tourniquet was deflated at 58 minutes.  Fingertips were pink with brisk capillary refill after deflation of the tourniquet.  A volar splint was placed including the long ring and small fingers.  This was wrapped with Kerlix and Ace bandage. The operative  drapes were broken down.   The patient was awoken from anesthesia safely.  He was transferred back to the stretcher and taken to PACU in stable condition.  I will see him back in the office in 1 week for postoperative followup.  I will give him a prescription for Norco 5/325 1-2 tabs PO q6 hours prn pain, dispense # 20.   Betha Loa, MD Electronically signed, 06/29/23

## 2023-06-29 NOTE — H&P (Signed)
Dennis Orr is an 67 y.o. male.   Chief Complaint: finger contracture HPI: 67 yo male with right ring finger dupuytrens contracture.  Present for 10-12 years.  It is bothersome to him.  He wishes to have right ring finger fasciectomy.  Allergies: No Known Allergies  Past Medical History:  Diagnosis Date   Allergy    Barrett's esophagus    Essential hypertension 08/27/2019   GERD (gastroesophageal reflux disease)    occ and uses nexium for this prn only   Hx of adenomatous colonic polyps 01/13/2016   Hyperlipidemia    Low bone density 03/20/2016   Shoulder pain, left 08/27/2019   Sleep apnea    Ulcerative colitis (HCC)    Vitamin D deficiency 03/10/2016    Past Surgical History:  Procedure Laterality Date   CHOLECYSTECTOMY N/A    COLONOSCOPY     TONSILLECTOMY     UPPER GASTROINTESTINAL ENDOSCOPY      Family History: Family History  Problem Relation Age of Onset   Colon cancer Mother    Breast cancer Sister    Hyperlipidemia Sister    Colon polyps Neg Hx    Esophageal cancer Neg Hx    Stomach cancer Neg Hx    Rectal cancer Neg Hx     Social History:   reports that he quit smoking about 28 years ago. His smoking use included cigarettes. He started smoking about 38 years ago. He has a 5 pack-year smoking history. He has never used smokeless tobacco. He reports current alcohol use of about 18.0 standard drinks of alcohol per week. He reports that he does not use drugs.  Medications: Medications Prior to Admission  Medication Sig Dispense Refill   atorvastatin (LIPITOR) 20 MG tablet Take 1 tablet (20 mg total) by mouth daily. 90 tablet 3   esomeprazole (NEXIUM) 20 MG capsule Take 1 capsule (20 mg total) by mouth daily before breakfast. 90 capsule 1   levothyroxine (SYNTHROID) 50 MCG tablet Take one each morning on an empty stomach 30-60 minutes before breakfast for thyroid hormone replacement 90 tablet 1   olmesartan (BENICAR) 20 MG tablet Take 1 tablet (20 mg total) by  mouth daily. Schedule Office Visit 90 tablet 2   Vitamin D, Ergocalciferol, (DRISDOL) 1.25 MG (50000 UNIT) CAPS capsule Take 50,000 Units by mouth once a week.     zolpidem (AMBIEN) 10 MG tablet Take 1 tablet (10 mg total) by mouth at bedtime as needed for sleep. 45 tablet 1    No results found for this or any previous visit (from the past 48 hour(s)).  No results found.    Blood pressure 131/82, pulse 66, temperature 97.6 F (36.4 C), temperature source Oral, resp. rate 16, height 5\' 7"  (1.702 m), weight 83.2 kg, SpO2 100%.  General appearance: alert, cooperative, and appears stated age Head: Normocephalic, without obvious abnormality, atraumatic Neck: supple, symmetrical, trachea midline Extremities: Intact sensation and capillary refill all digits.  +epl/fpl/io.  No wounds. Contracture right ring finger with palpable cord in palm and digit Pulses: 2+ and symmetric Skin: Skin color, texture, turgor normal. No rashes or lesions Neurologic: Grossly normal Incision/Wound: none  Assessment/Plan Right ring finger dupuytrens contracture.  Non operative and operative treatment options have been discussed with the patient and patient wishes to proceed with operative treatment. Risks, benefits, and alternatives of surgery have been discussed and the patient agrees with the plan of care.   Betha Loa 06/29/2023, 7:35 AM

## 2023-06-29 NOTE — Transfer of Care (Signed)
Immediate Anesthesia Transfer of Care Note  Patient: Sparrow [place]  Procedure(s) Performed: EXCISION DUPUYTREN'S CONTRACTURE RIGHT PALM AND RIGHT RING FINGER (Right: Hand)  Patient Location: PACU  Anesthesia Type:General  Level of Consciousness: drowsy and patient cooperative  Airway & Oxygen Therapy: Patient Spontanous Breathing and Patient connected to face mask oxygen  Post-op Assessment: Report given to RN and Post -op Vital signs reviewed and stable  Post vital signs: Reviewed and stable  Last Vitals:  Vitals Value Taken Time  BP    Temp    Pulse 77 06/29/23 1035  Resp    SpO2 99 % 06/29/23 1035  Vitals shown include unfiled device data.  Last Pain:  Vitals:   06/29/23 0658  TempSrc: Oral  PainSc: 0-No pain      Patients Stated Pain Goal: 3 (06/29/23 9562)  Complications: No notable events documented.

## 2023-06-29 NOTE — Discharge Instructions (Addendum)
[facility] Instructions Hand Surgery  Wound Care: Keep your hand elevated above the level of your heart.  Do not allow it to dangle by your side.  Keep the dressing dry and do not remove it unless your doctor advises you to do so.  He will usually change it at the time of your post-op visit.  Moving your fingers is advised to stimulate circulation but will depend on the site of your surgery.  If you have a splint applied, your doctor will advise you regarding movement.  Activity: Do not drive or operate machinery today.  Rest today and then you may return to your normal activity and work as indicated by your physician.  Diet:  Drink liquids today or eat a light diet.  You may resume a regular diet tomorrow.    General expectations: Pain for two to three days. Fingers may become slightly swollen.  Call your doctor if any of the following occur: Severe pain not relieved by pain medication. Elevated temperature. Dressing soaked with blood. Inability to move fingers. White or bluish color to fingers.      Post Anesthesia Home Care Instructions  Activity: Get plenty of rest for the remainder of the day. A responsible individual must stay with you for 24 hours following the procedure.  For the next 24 hours, DO NOT: -Drive a car -Advertising copywriter -Drink alcoholic beverages -Take any medication unless instructed by your physician -Make any legal decisions or sign important papers.  Meals: Start with liquid foods such as gelatin or soup. Progress to regular foods as tolerated. Avoid greasy, spicy, heavy foods. If nausea and/or vomiting occur, drink only clear liquids until the nausea and/or vomiting subsides. Call your physician if vomiting continues.  Special Instructions/Symptoms: Your throat may feel dry or sore from the anesthesia or the breathing tube placed in your throat during surgery. If this causes discomfort, gargle with warm salt water. The discomfort should disappear  within 24 hours.  If you had a scopolamine patch placed behind your ear for the management of post- operative nausea and/or vomiting:  1. The medication in the patch is effective for 72 hours, after which it should be removed.  Wrap patch in a tissue and discard in the trash. Wash hands thoroughly with soap and water. 2. You may remove the patch earlier than 72 hours if you experience unpleasant side effects which may include dry mouth, dizziness or visual disturbances. 3. Avoid touching the patch. Wash your hands with soap and water after contact with the patch.     Next dose tylenol not available until 1pm Next dose ibuprofen not available until 430pm

## 2023-06-29 NOTE — Anesthesia Postprocedure Evaluation (Signed)
Anesthesia Post Note  Patient: Dennis Orr  Procedure(s) Performed: EXCISION DUPUYTREN'S CONTRACTURE RIGHT PALM AND RIGHT RING FINGER (Right: Hand)     Patient location during evaluation: PACU Anesthesia Type: General Level of consciousness: awake and alert Pain management: pain level controlled Vital Signs Assessment: post-procedure vital signs reviewed and stable Respiratory status: spontaneous breathing, nonlabored ventilation, respiratory function stable and patient connected to nasal cannula oxygen Cardiovascular status: blood pressure returned to baseline and stable Postop Assessment: no apparent nausea or vomiting Anesthetic complications: no   No notable events documented.  Last Vitals:  Vitals:   06/29/23 1115 06/29/23 1135  BP: (!) 140/82 139/77  Pulse: 76 78  Resp: 10 18  Temp:  (!) 36.3 C  SpO2: 99% 96%    Last Pain:  Vitals:   06/29/23 1135  TempSrc:   PainSc: 5                  Collene Schlichter

## 2023-06-29 NOTE — Anesthesia Procedure Notes (Signed)
Procedure Name: LMA Insertion Date/Time: 06/29/2023 9:10 AM  Performed by: Thornell Mule, CRNAPre-anesthesia Checklist: Patient identified, Emergency Drugs available, Suction available and Patient being monitored Patient Re-evaluated:Patient Re-evaluated prior to induction Oxygen Delivery Method: Circle system utilized Preoxygenation: Pre-oxygenation with 100% oxygen Induction Type: IV induction LMA: LMA inserted LMA Size: 4.0 Number of attempts: 1 Placement Confirmation: positive ETCO2 Tube secured with: Tape Dental Injury: Teeth and Oropharynx as per pre-operative assessment

## 2023-06-29 NOTE — Op Note (Signed)
I assisted Surgeons and Role:    * Betha Loa, MD - Primary    * Cindee Salt, MD - Assisting on the Procedure(s): EXCISION DUPUYTREN'S CONTRACTURE RIGHT PALM AND RIGHT RING FINGER on 06/29/2023.  I provided assistance on this case as follows: Set up, approach, elevation of flaps, protection of the neurovascular bundles, removal of the palmar fascia, closure of the wound and application of the dressing and splint.  Electronically signed by: Cindee Salt, MD Date: 06/29/2023 Time: 10:28 AM

## 2023-06-30 ENCOUNTER — Encounter (HOSPITAL_BASED_OUTPATIENT_CLINIC_OR_DEPARTMENT_OTHER): Payer: Self-pay | Admitting: Orthopedic Surgery

## 2023-06-30 LAB — SURGICAL PATHOLOGY
